# Patient Record
Sex: Female | Born: 1970 | Race: White | Hispanic: No | Marital: Married | Smoking: Former smoker
Health system: Southern US, Community
[De-identification: ages and names within clinical notes are randomized; demographics above are authoritative.]

## PROBLEM LIST (undated history)

## (undated) DIAGNOSIS — F329 Major depressive disorder, single episode, unspecified: Secondary | ICD-10-CM

## (undated) DIAGNOSIS — F32A Depression, unspecified: Secondary | ICD-10-CM

## (undated) DIAGNOSIS — Z8719 Personal history of other diseases of the digestive system: Secondary | ICD-10-CM

## (undated) HISTORY — PX: KNEE SURGERY: SHX244

---

## 1999-05-11 HISTORY — PX: OTHER SURGICAL HISTORY: SHX169

## 2001-12-31 ENCOUNTER — Emergency Department (HOSPITAL_COMMUNITY): Admission: EM | Admit: 2001-12-31 | Discharge: 2001-12-31 | Payer: Self-pay | Admitting: Emergency Medicine

## 2004-05-10 HISTORY — PX: HERNIA REPAIR: SHX51

## 2005-03-24 ENCOUNTER — Encounter (INDEPENDENT_AMBULATORY_CARE_PROVIDER_SITE_OTHER): Payer: Self-pay | Admitting: Specialist

## 2005-03-24 ENCOUNTER — Ambulatory Visit (HOSPITAL_BASED_OUTPATIENT_CLINIC_OR_DEPARTMENT_OTHER): Admission: RE | Admit: 2005-03-24 | Discharge: 2005-03-24 | Payer: Self-pay | Admitting: General Surgery

## 2005-03-24 ENCOUNTER — Ambulatory Visit (HOSPITAL_COMMUNITY): Admission: RE | Admit: 2005-03-24 | Discharge: 2005-03-24 | Payer: Self-pay | Admitting: General Surgery

## 2007-05-09 ENCOUNTER — Emergency Department (HOSPITAL_COMMUNITY): Admission: EM | Admit: 2007-05-09 | Discharge: 2007-05-09 | Payer: Self-pay | Admitting: Emergency Medicine

## 2007-05-11 HISTORY — PX: BREAST SURGERY: SHX581

## 2008-03-12 ENCOUNTER — Encounter: Admission: RE | Admit: 2008-03-12 | Discharge: 2008-03-12 | Payer: Self-pay | Admitting: Orthopedic Surgery

## 2010-09-25 NOTE — Op Note (Signed)
[name], Toni Copeland               ACCOUNT NO.:  [id number]   MEDICAL RECORD NO.:  [id number]          PATIENT TYPE:  AMB   LOCATION:  [facility]                         FACILITY:  [facility]   PHYSICIAN:  Leonie Man, M.D.   DATE OF BIRTH:  [birth year]   DATE OF PROCEDURE:  03/24/2005  DATE OF DISCHARGE:                                 OPERATIVE REPORT   PREOPERATIVE DIAGNOSES:  Lipoma left back.   POSTOPERATIVE DIAGNOSES:  Lipoma left back.   PROCEDURE:  Excision lipoma left back.   SURGEON:  Leonie Man, M.D.   ASSISTANT:  OR tech.   ANESTHESIA:  General. The patient is a 39 year old woman with a lipoma of  her left back which has been causing her some mild discomfort. She wishes to  have this removed. She comes to the operating room after the risks and  potential benefits of surgery have been discussed. All questions answered,  consent obtained.   PROCEDURE:  Following the induction of satisfactory general anesthesia, the  patient is turned into the prone position and the mass which was located on  the end of the right subscapular region is palpated. The skin over the mass  is prepped and draped to be included in a sterile operative field. I  infiltrated the subcutaneous tissues with 0.25% Marcaine with epinephrine. A  transverse incision is made over the mass deep and through the skin and  subcutaneous tissue, carried down to the capsule of the lipoma which was  then opened. The lipoma is dissected free. There was a portion of the tongue  of the lipoma which went intramuscularly underneath the latissimus dorsi  muscle which was then retracted and the lipoma dissected from the  submuscular capsule. The mass is removed and forwarded for pathologic  evaluation. Hemostasis is assured with electrocautery. Sponge and instrument  counts verified. The subcutaneous tissue is closed with a running 3-0  Vicryl, skin closed with a running 4-0 Monocryl and then reinforced with  Steri-Strips. A sterile compressive dressing is applied, the anesthetic  reversed and the patient removed from the operating room to the recovery  room in stable condition. She tolerated the procedure well.      Leonie Man, M.D.  Electronically Signed     PB/MEDQ  D:  03/24/2005  T:  03/24/2005  Job:  [id number]

## 2011-12-29 ENCOUNTER — Other Ambulatory Visit (HOSPITAL_COMMUNITY): Payer: Self-pay | Admitting: Gynecology

## 2011-12-29 DIAGNOSIS — Z3141 Encounter for fertility testing: Secondary | ICD-10-CM

## 2012-01-04 ENCOUNTER — Ambulatory Visit (HOSPITAL_COMMUNITY)
Admission: RE | Admit: 2012-01-04 | Discharge: 2012-01-04 | Disposition: A | Discharge status: Home / Self Care | Payer: BC Managed Care – PPO | Source: Ambulatory Visit | Attending: Gynecology | Admitting: Gynecology

## 2012-01-04 DIAGNOSIS — Z3141 Encounter for fertility testing: Secondary | ICD-10-CM

## 2012-01-04 DIAGNOSIS — N979 Female infertility, unspecified: Secondary | ICD-10-CM | POA: Insufficient documentation

## 2012-01-04 MED ORDER — IOHEXOL 300 MG/ML  SOLN: 9.0000 mL | Freq: Once | INTRAMUSCULAR | Status: AC | PRN

## 2013-02-20 ENCOUNTER — Ambulatory Visit (INDEPENDENT_AMBULATORY_CARE_PROVIDER_SITE_OTHER): Payer: BC Managed Care – PPO | Admitting: General Surgery

## 2013-02-20 ENCOUNTER — Telehealth (INDEPENDENT_AMBULATORY_CARE_PROVIDER_SITE_OTHER): Payer: Self-pay | Admitting: *Deleted

## 2013-02-20 ENCOUNTER — Encounter (INDEPENDENT_AMBULATORY_CARE_PROVIDER_SITE_OTHER): Payer: Self-pay | Admitting: General Surgery

## 2013-02-20 ENCOUNTER — Encounter (INDEPENDENT_AMBULATORY_CARE_PROVIDER_SITE_OTHER): Payer: Self-pay

## 2013-02-20 VITALS — BP 122/82 | HR 93 | Resp 16 | Ht 69.0 in | Wt 206.6 lb

## 2013-02-20 DIAGNOSIS — M549 Dorsalgia, unspecified: Secondary | ICD-10-CM | POA: Insufficient documentation

## 2013-02-20 NOTE — Telephone Encounter (Signed)
LMOM for pt to return my call.  I was calling to inform her of an appt for her CT scan at [facility] on 02/23/13 with an arrival time of 3:00pm.  Pt needs to pick contrast up from the 301 location at least 1 day prior to scan.  NO solid foods 4 hours prior to scan.  Pt must drink 1st bottle of contrast at 1:20pm and second bottle at 2:20pm.

## 2013-02-20 NOTE — Patient Instructions (Signed)
Will call with results of CT Call your orthopedic doc about right shoulder pain

## 2013-02-23 ENCOUNTER — Ambulatory Visit
Admission: RE | Admit: 2013-02-23 | Discharge: 2013-02-23 | Disposition: A | Discharge status: Home / Self Care | Payer: BC Managed Care – PPO | Source: Ambulatory Visit | Attending: General Surgery | Admitting: General Surgery

## 2013-02-23 DIAGNOSIS — M549 Dorsalgia, unspecified: Secondary | ICD-10-CM

## 2013-02-23 MED ORDER — IOHEXOL 300 MG/ML  SOLN
125.0000 mL | Freq: Once | INTRAMUSCULAR | Status: AC | PRN
  Administered 2013-02-23: 125 mL via INTRAVENOUS

## 2013-02-27 ENCOUNTER — Telehealth (INDEPENDENT_AMBULATORY_CARE_PROVIDER_SITE_OTHER): Payer: Self-pay

## 2013-02-27 NOTE — Telephone Encounter (Signed)
Called patient with negative CT results.

## 2013-02-27 NOTE — Telephone Encounter (Signed)
Message copied by Brennan Bailey on Tue Feb 27, 2013 10:55 AM ------      Message from: Caleen Essex III      Created: Mon Feb 26, 2013 12:15 PM       Negative exam ------

## 2013-03-15 ENCOUNTER — Other Ambulatory Visit: Payer: Self-pay

## 2013-03-27 NOTE — Progress Notes (Signed)
Patient ID: Toni Copeland, female   DOB: Apr 07, 1971, 42 y.o.   MRN: [id number]  Chief Complaint  Patient presents with  . New Evaluation    eval lump on back    HPI Toni Copeland is a 42 y.o. female.  We are asked to see the patient in consultation by Dr. Piedad Climes to evaluate her for a mass on her back. The patient is a 42 year old white female 2 has been having pain in her back for the last year or 2. She notices it mostly when she is pushing something. She states that she had a mass removed from her back in this location about 10-15 years ago. Her main complaint today is of shoulder pain.  HPI  History reviewed. No pertinent past medical history.  Past Surgical History  Procedure Laterality Date  . Knee surgery Left 1990's&2008  . Breast surgery  2009    Reduction  . Lipoma removal  2001    back  . Hernia repair  2006    Ventral hernia repair    Family History  Problem Relation Age of Onset  . Cancer Brother     Brain    Social History History  Substance Use Topics  . Smoking status: Former Smoker    Quit date: 01/22/2011  . Smokeless tobacco: Not on file  . Alcohol Use: Yes     Comment: 3 drinks a week    Allergies  Allergen Reactions  . Levaquin [Levofloxacin] Hives    No current outpatient prescriptions on file.   No current facility-administered medications for this visit.    Review of Systems Review of Systems  Constitutional: Negative.   HENT: Negative.   Eyes: Negative.   Respiratory: Negative.   Cardiovascular: Negative.   Gastrointestinal: Negative.   Endocrine: Negative.   Genitourinary: Negative.   Musculoskeletal: Positive for arthralgias and back pain.  Skin: Negative.   Allergic/Immunologic: Negative.   Neurological: Negative.   Hematological: Negative.   Psychiatric/Behavioral: Negative.     Blood pressure 122/82, pulse 93, resp. rate 16, height 5\' 9"  (1.753 m), weight 206 lb 9.6 oz (93.713 kg), SpO2 99.00%.  Physical  Exam Physical Exam  Constitutional: She is oriented to person, place, and time. She appears well-developed and well-nourished.  HENT:  Head: Normocephalic and atraumatic.  Eyes: Conjunctivae and EOM are normal. Pupils are equal, round, and reactive to light.  Neck: Normal range of motion. Neck supple.  Cardiovascular: Normal rate, regular rhythm and normal heart sounds.   Pulmonary/Chest: Effort normal and breath sounds normal.  I do not appreciate an palpable mass in her back and her site of previous resection  Abdominal: Soft. Bowel sounds are normal. There is no tenderness.  Musculoskeletal:  She has significant pain in her shoulder with range of motion  Neurological: She is alert and oriented to person, place, and time.  Skin: Skin is warm and dry.  Psychiatric: She has a normal mood and affect. Her behavior is normal.    Data Reviewed As above  Assessment    The patient is concerned that she has a recurrence of the mass on her back. I do not appreciate a mass on physical exam today. Because of this I think it would be reasonable to obtain a CT scan of her abdomen and pelvis to look for any evidence of recurrence. I also think she needs to see an orthopedic surgeon for her shoulder and I have called Dr. Allie Bossier and he has agreed to see  her today     Plan    Plan for CT scan of her abdomen and pelvis as well as a referral to an orthopedic surgeon to day        TOTH III,Nimra Puccinelli S 03/27/2013, 10:28 AM

## 2014-11-23 IMAGING — CT CT ABD-PELV W/ CM
2 of 4 series · 17 of 46 positions shown, 19 images · IV contrast (READICAT/WATER & [ID] OMNI 300)
Comparison: None.

CLINICAL DATA: Worsening right upper flank pain. Evaluate for
lipoma.

EXAM:
CT ABDOMEN AND PELVIS WITH CONTRAST
TECHNIQUE: Multidetector CT imaging of the abdomen and pelvis was performed
using the standard protocol following bolus administration of
intravenous contrast.
CONTRAST:  125mL OMNIPAQUE IOHEXOL 300 MG/ML  SOLN

[Series 3: abd/pelvis with · axial · 0.76mm/px · z∈[-445,+20]mm · 14 of 103 slices shown, 16 images]
[im 5/103  soft-tissue]
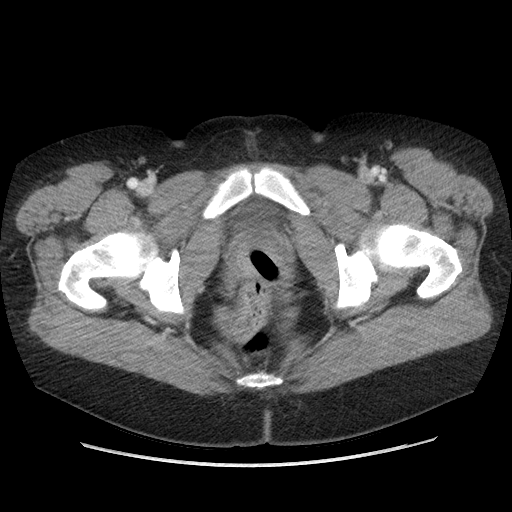
[im 5/103  bone]
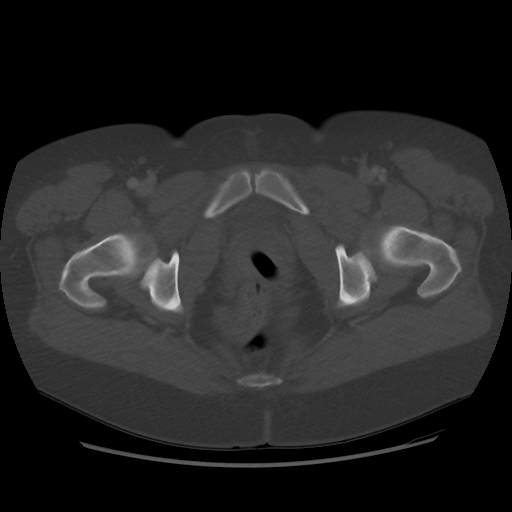
[im 14/103  soft-tissue]
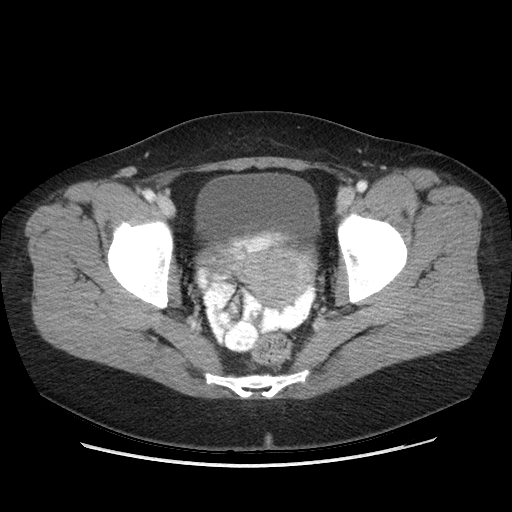
[im 18/103  soft-tissue]
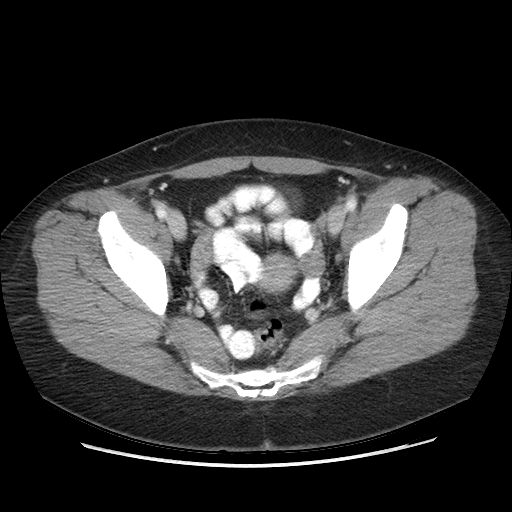
[im 27/103  soft-tissue]
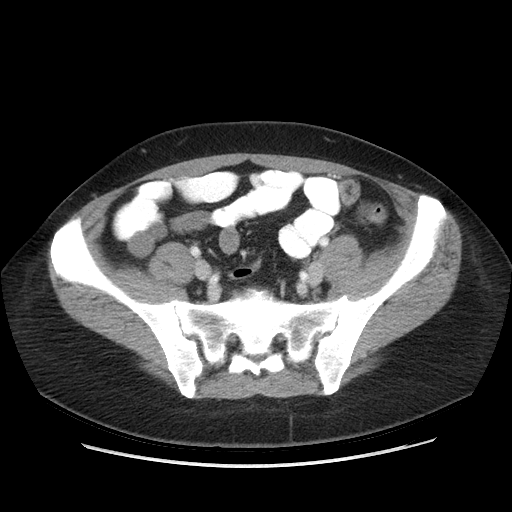
[im 36/103  soft-tissue]
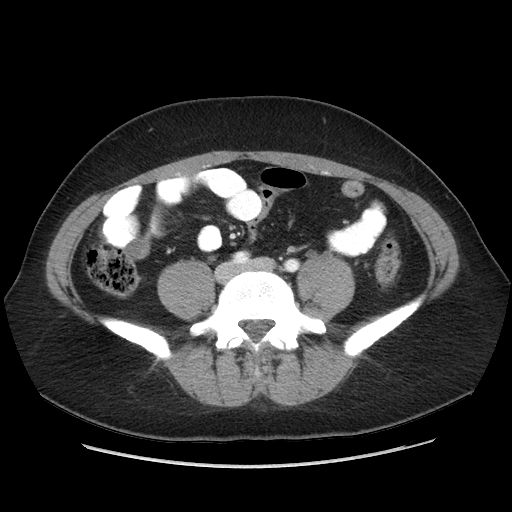
[im 40/103  soft-tissue]
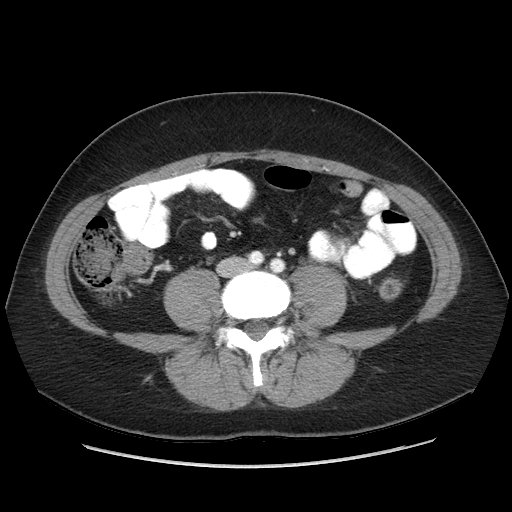
[im 49/103  soft-tissue]
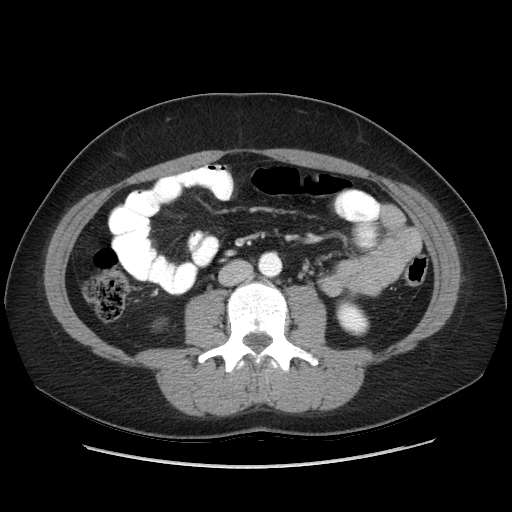
[im 54/103  soft-tissue]
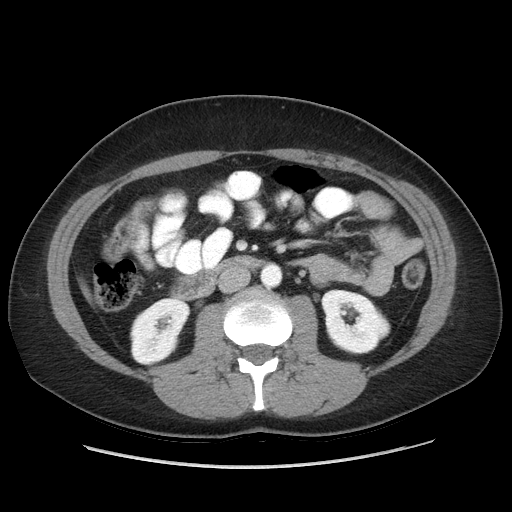
[im 63/103  soft-tissue]
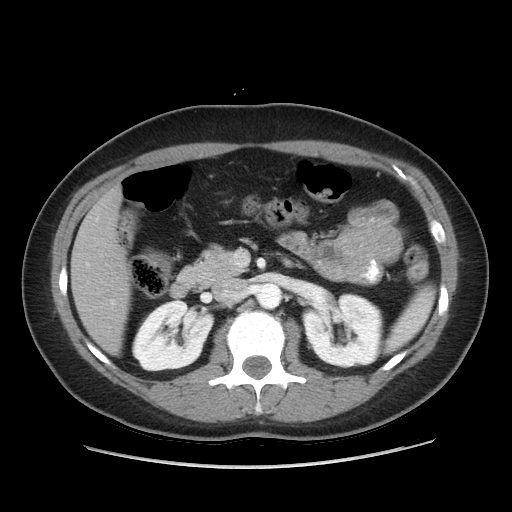
[im 63/103  bone]
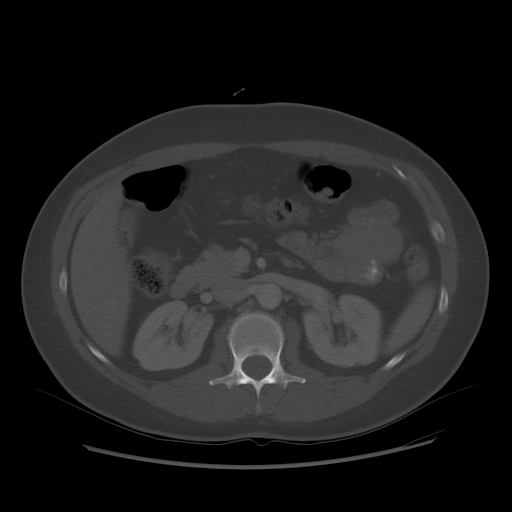
[im 67/103  soft-tissue]
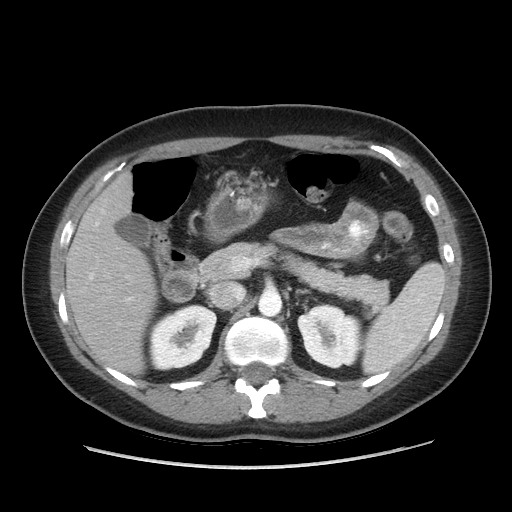
[im 76/103  soft-tissue]
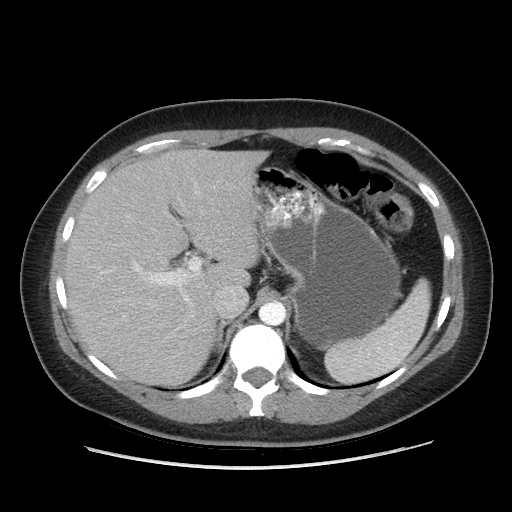
[im 85/103  soft-tissue]
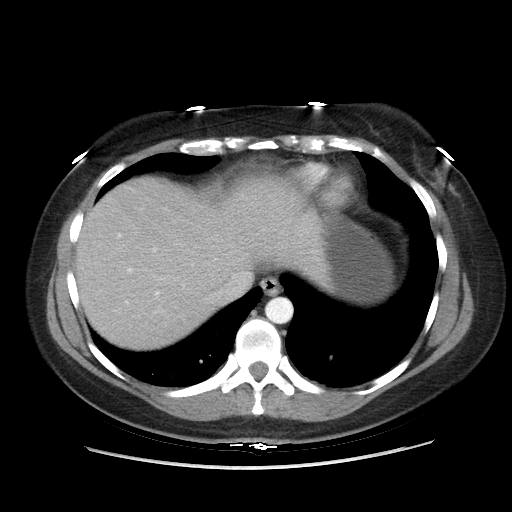
[im 89/103  soft-tissue]
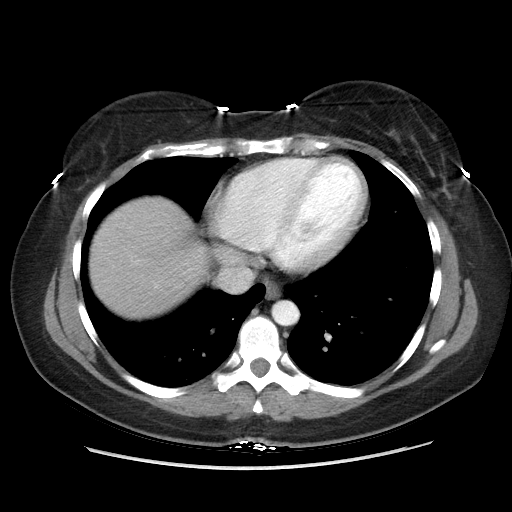
[im 98/103  soft-tissue]
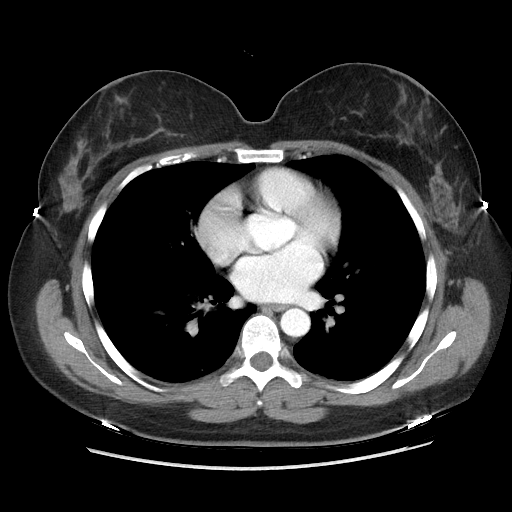

[Series 200: coronal · coronal · 1.08mm/px · 3 of 126 slices shown]
[im 42/126  soft-tissue]
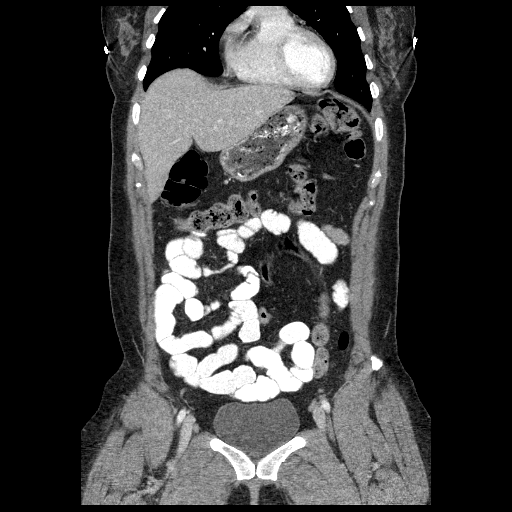
[im 56/126  soft-tissue]
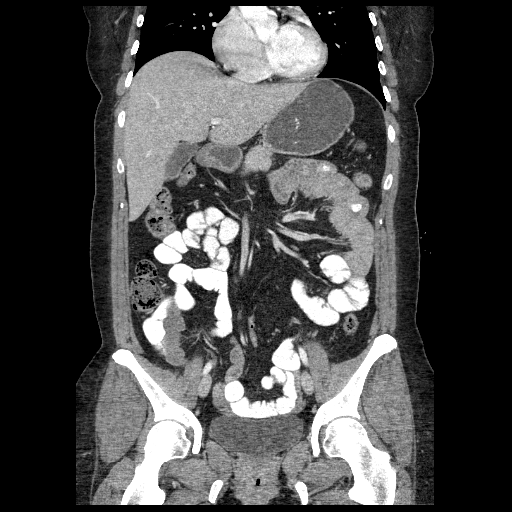
[im 70/126  soft-tissue]
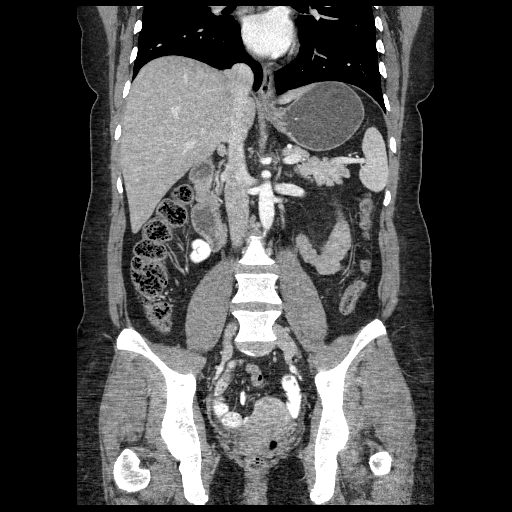

[17 of 46 positions shown; findings below may reference images not displayed]

FINDINGS: The lung bases appear clear. There is no pleural or pericardial
effusion identified. No focal liver abnormality identified. The
gallbladder appears normal. No biliary dilatation. Normal appearance
of the pancreas. The spleen is unremarkable.

The adrenal glands are both normal. The kidneys are both stress set
normal appearance of the right kidney. There is a small (8 mm cyst
within the upper pole the left kidney.

Urinary bladder appears normal. Uterus and the adnexal structures
are unremarkable.

Normal caliber of the abdominal aorta. No aneurysm. There is no
upper abdominal adenopathy identified. No pelvic or inguinal
adenopathy noted.

The stomach appears normal. The small bowel loops are normal in
course and caliber. No obstruction. Normal appearance of the colon.

Review of the visualized bony structures is on unremarkable. No
worrisome lytic or sclerotic bone lesions. A fiducial marker was
placed over the right flank in the area of concern. No underlying
mass identified, image 30/series 3.
IMPRESSION: 1.  Normal exam.

## 2018-06-08 ENCOUNTER — Other Ambulatory Visit: Payer: Self-pay | Admitting: Gastroenterology

## 2018-06-08 DIAGNOSIS — R1011 Right upper quadrant pain: Secondary | ICD-10-CM

## 2018-06-22 ENCOUNTER — Encounter (HOSPITAL_COMMUNITY): Payer: Self-pay

## 2018-06-22 ENCOUNTER — Encounter (HOSPITAL_COMMUNITY)
Admission: RE | Admit: 2018-06-22 | Discharge: 2018-06-22 | Disposition: A | Discharge status: Home / Self Care | Payer: BLUE CROSS/BLUE SHIELD | Source: Ambulatory Visit | Attending: Gastroenterology | Admitting: Gastroenterology

## 2018-06-22 ENCOUNTER — Encounter (HOSPITAL_COMMUNITY): Admission: RE | Admit: 2018-06-22 | Payer: BLUE CROSS/BLUE SHIELD | Source: Ambulatory Visit

## 2018-06-22 DIAGNOSIS — R1011 Right upper quadrant pain: Secondary | ICD-10-CM | POA: Diagnosis not present

## 2018-07-05 ENCOUNTER — Other Ambulatory Visit: Payer: Self-pay | Admitting: Surgery

## 2018-07-06 ENCOUNTER — Encounter (HOSPITAL_COMMUNITY): Payer: Self-pay | Admitting: *Deleted

## 2018-07-10 NOTE — Patient Instructions (Addendum)
Toni Copeland  07/10/2018   Your procedure is scheduled on: 07-13-18    Report to [facility] Main  Entrance    Report to Admitting at 7:55 AM    Call this number if you have problems the morning of surgery [phone]    Remember: Do not eat food or drink liquids :After Midnight. NO SOLID FOOD AFTER MIDNIGHT THE NIGHT PRIOR TO SURGERY. NOTHING BY MOUTH EXCEPT CLEAR LIQUIDS UNTIL 3 HOURS PRIOR TO SCHEULED SURGERY. PLEASE FINISH ENSURE DRINK PER SURGEON ORDER 3 HOURS PRIOR TO SCHEDULED SURGERY TIME WHICH NEEDS TO BE COMPLETED AT 6:55 AM.    CLEAR LIQUID DIET   Foods Allowed                                                                     Foods Excluded  Coffee and tea, regular and decaf                             liquids that you cannot  Plain Jell-O in any flavor                                             see through such as: Fruit ices (not with fruit pulp)                                     milk, soups, orange juice  Iced Popsicles                                    All solid food Carbonated beverages, regular and diet                                    Cranberry, grape and apple juices Sports drinks like Gatorade Lightly seasoned clear broth or consume(fat free) Sugar, honey syrup  Sample Menu Breakfast                                Lunch                                     Supper Cranberry juice                    Beef broth                            Chicken broth Jell-O  Grape juice                           Apple juice Coffee or tea                        Jell-O                                      Popsicle                                                Coffee or tea                        Coffee or tea  _____________________________________________________________________    BRUSH YOUR TEETH MORNING OF SURGERY AND RINSE YOUR MOUTH OUT, NO CHEWING GUM CANDY OR MINTS.     Take these medicines the  morning of surgery with A SIP OF WATER:  None                                You may not have any metal on your body including hair pins and              piercings  Do not wear jewelry, make-up, lotions, powders or perfumes, deodorant             Do not wear nail polish.  Do not shave  48 hours prior to surgery.             Do not bring valuables to the hospital. [facility] IS NOT             RESPONSIBLE   FOR VALUABLES.  Contacts, dentures or bridgework may not be worn into surgery.       Patients discharged the day of surgery will not be allowed to drive home. IF YOU ARE HAVING SURGERY AND GOING HOME THE SAME DAY, YOU MUST HAVE AN ADULT TO DRIVE YOU HOME AND BE WITH YOU FOR 24 HOURS. YOU MAY GO HOME BY TAXI OR UBER OR ORTHERWISE, BUT AN ADULT MUST ACCOMPANY YOU HOME AND STAY WITH YOU FOR 24 HOURS.    Name and phone number of your driver: Donnelly Stager [phone]  Special Instructions: N/A              Please read over the following fact sheets you were given: _____________________________________________________________________             [facility] - Preparing for Surgery Before surgery, you can play an important role.  Because skin is not sterile, your skin needs to be as free of germs as possible.  You can reduce the number of germs on your skin by washing with CHG (chlorahexidine gluconate) soap before surgery.  CHG is an antiseptic cleaner which kills germs and bonds with the skin to continue killing germs even after washing. Please DO NOT use if you have an allergy to CHG or antibacterial soaps.  If your skin becomes reddened/irritated stop using the CHG and inform your nurse when you arrive at Short Stay. Do not shave (including legs and underarms) for at least 48 hours  prior to the first CHG shower.  You may shave your face/neck. Please follow these instructions carefully:  1.  Shower with CHG Soap the night before surgery and the  morning of Surgery.  2.  If you choose  to wash your hair, wash your hair first as usual with your  normal  shampoo.  3.  After you shampoo, rinse your hair and body thoroughly to remove the  shampoo.                           4.  Use CHG as you would any other liquid soap.  You can apply chg directly  to the skin and wash                       Gently with a scrungie or clean washcloth.  5.  Apply the CHG Soap to your body ONLY FROM THE NECK DOWN.   Do not use on face/ open                           Wound or open sores. Avoid contact with eyes, ears mouth and genitals (private parts).                       Wash face,  Genitals (private parts) with your normal soap.             6.  Wash thoroughly, paying special attention to the area where your surgery  will be performed.  7.  Thoroughly rinse your body with warm water from the neck down.  8.  DO NOT shower/wash with your normal soap after using and rinsing off  the CHG Soap.                9.  Pat yourself dry with a clean towel.            10.  Wear clean pajamas.            11.  Place clean sheets on your bed the night of your first shower and do not  sleep with pets. Day of Surgery : Do not apply any lotions/deodorants the morning of surgery.  Please wear clean clothes to the hospital/surgery center.  FAILURE TO FOLLOW THESE INSTRUCTIONS MAY RESULT IN THE CANCELLATION OF YOUR SURGERY PATIENT SIGNATURE_________________________________  NURSE SIGNATURE__________________________________  ________________________________________________________________________

## 2018-07-11 ENCOUNTER — Other Ambulatory Visit: Payer: Self-pay

## 2018-07-11 ENCOUNTER — Encounter (HOSPITAL_COMMUNITY): Payer: Self-pay

## 2018-07-11 ENCOUNTER — Encounter (HOSPITAL_COMMUNITY)
Admission: RE | Admit: 2018-07-11 | Discharge: 2018-07-11 | Disposition: A | Discharge status: Home / Self Care | Payer: BLUE CROSS/BLUE SHIELD | Source: Ambulatory Visit | Attending: Surgery | Admitting: Surgery

## 2018-07-11 DIAGNOSIS — Z01812 Encounter for preprocedural laboratory examination: Secondary | ICD-10-CM | POA: Diagnosis present

## 2018-07-11 HISTORY — DX: Personal history of other diseases of the digestive system: Z87.19

## 2018-07-11 HISTORY — DX: Depression, unspecified: F32.A

## 2018-07-11 HISTORY — DX: Major depressive disorder, single episode, unspecified: F32.9

## 2018-07-11 LAB — CBC
HCT: 41.4 % (ref 36.0–46.0)
Hemoglobin: 13.6 g/dL (ref 12.0–15.0)
MCH: 30.6 pg (ref 26.0–34.0)
MCHC: 32.9 g/dL (ref 30.0–36.0)
MCV: 93 fL (ref 80.0–100.0)
Platelets: 228 10*3/uL (ref 150–400)
RBC: 4.45 MIL/uL (ref 3.87–5.11)
RDW: 12.4 % (ref 11.5–15.5)
WBC: 8.2 10*3/uL (ref 4.0–10.5)
nRBC: 0 % (ref 0.0–0.2)

## 2018-07-11 LAB — PREGNANCY, URINE: Preg Test, Ur: NEGATIVE

## 2018-07-12 NOTE — Anesthesia Preprocedure Evaluation (Addendum)
Anesthesia Evaluation  Patient identified by MRN, date of birth, ID band Patient awake    Reviewed: Allergy & Precautions, NPO status , Patient's Chart, lab work & pertinent test results  Airway Mallampati: II  TM Distance: >3 FB Neck ROM: Full    Dental  (+) Dental Advisory Given, Teeth Intact   Pulmonary neg pulmonary ROS, former smoker,    Pulmonary exam normal breath sounds clear to auscultation       Cardiovascular negative cardio ROS Normal cardiovascular exam Rhythm:Regular Rate:Normal     Neuro/Psych PSYCHIATRIC DISORDERS Depression negative neurological ROS     GI/Hepatic Neg liver ROS, hiatal hernia,   Endo/Other  negative endocrine ROS  Renal/GU negative Renal ROS     Musculoskeletal negative musculoskeletal ROS (+)   Abdominal (+) + obese,   Peds  Hematology negative hematology ROS (+)   Anesthesia Other Findings Day of surgery medications reviewed with the patient.  Reproductive/Obstetrics                            Anesthesia Physical Anesthesia Plan  ASA: II  Anesthesia Plan: General   Post-op Pain Management:    Induction: Intravenous  PONV Risk Score and Plan: 4 or greater and Ondansetron, Dexamethasone, Midazolam, Scopolamine patch - Pre-op and Treatment may vary due to age or medical condition  Airway Management Planned: Oral ETT  Additional Equipment: None  Intra-op Plan:   Post-operative Plan: Extubation in OR  Informed Consent: I have reviewed the patients History and Physical, chart, labs and discussed the procedure including the risks, benefits and alternatives for the proposed anesthesia with the patient or authorized representative who has indicated his/her understanding and acceptance.     Dental advisory given  Plan Discussed with: CRNA  Anesthesia Plan Comments:         Anesthesia Quick Evaluation

## 2018-07-12 NOTE — H&P (Signed)
Toni Copeland Documented: 07/05/2018 3:03 PM Location: [facility] Patient #: [id number] DOB: 1970/09/12 Single / Language: [place] / Race: White Female   History of Present Illness Toni Copeland A. Magnus Ivan MD; 07/05/2018 3:30 PM) The patient is a 48 year old female who presents for evaluation of gall stones. this patient is referred by Dr. Charna Elizabeth for symptomatic gallstones. She has been having abdominal pain in the epigastrium with nausea and bloating for several months. It is getting worse. It occurs after fatty meals. She has had no emesis. The pain is moderate in intensity. She has been having loose bowel movements as well. She had an ultrasound showing multiple gallstones. The bile duct was normal. She has had a mildly elevated AST and ALT in the past. She is otherwise healthy without complaints.   Past Surgical History Toni Copeland, CMA; 07/05/2018 3:26 PM) Knee Surgery  Left. Mammoplasty; Reduction  Bilateral. Ventral / Umbilical Hernia Surgery  Left.  Diagnostic Studies History Toni Copeland, CMA; 07/05/2018 3:26 PM) Mammogram  never Pap Smear  >5 years ago  Allergies Toni Copeland, CMA; 07/05/2018 3:04 PM) Levaquin *Fluoroquinolones**   Medication History Toni Copeland, CMA; 07/05/2018 3:04 PM) No Current Medications Medications Reconciled  Social History Toni Copeland, CMA; 07/05/2018 3:26 PM) Alcohol use  Occasional alcohol use. Caffeine use  Carbonated beverages, Tea. No drug use  Tobacco use  Former smoker.  Family History Toni Copeland, CMA; 07/05/2018 3:26 PM) Cancer  Brother. Hypertension  Mother. Seizure disorder  Brother.  Pregnancy / Birth History Toni Copeland, CMA; 07/05/2018 3:26 PM) Age at menarche  15 years. Gravida  1 Maternal age  68-40 Para  0 Regular periods   Other Problems Toni Copeland, CMA; 07/05/2018 3:26 PM) Anxiety Disorder  Back Pain  Cholelithiasis  Depression  Hemorrhoids   Umbilical Hernia Repair     Review of Systems (Toni Copeland CMA; 07/05/2018 3:26 PM) General Present- Appetite Loss and Fatigue. Not Present- Chills, Fever, Night Sweats, Weight Gain and Weight Loss. Skin Not Present- Change in Wart/Mole, Dryness, Hives, Jaundice, New Lesions, Non-Healing Wounds, Rash and Ulcer. HEENT Present- Wears glasses/contact lenses. Not Present- Earache, Hearing Loss, Hoarseness, Nose Bleed, Oral Ulcers, Ringing in the Ears, Seasonal Allergies, Sinus Pain, Sore Throat, Visual Disturbances and Yellow Eyes. Respiratory Not Present- Bloody sputum, Chronic Cough, Difficulty Breathing, Snoring and Wheezing. Breast Not Present- Breast Mass, Breast Pain, Nipple Discharge and Skin Changes. Cardiovascular Not Present- Chest Pain, Difficulty Breathing Lying Down, Leg Cramps, Palpitations, Rapid Heart Rate, Shortness of Breath and Swelling of Extremities. Gastrointestinal Present- Abdominal Pain, Bloating, Change in Bowel Habits, Chronic diarrhea, Constipation, Excessive gas, Hemorrhoids and Nausea. Not Present- Bloody Stool, Difficulty Swallowing, Gets full quickly at meals, Indigestion, Rectal Pain and Vomiting. Musculoskeletal Present- Back Pain. Not Present- Joint Pain, Joint Stiffness, Muscle Pain, Muscle Weakness and Swelling of Extremities. Neurological Not Present- Decreased Memory, Fainting, Headaches, Numbness, Seizures, Tingling, Tremor, Trouble walking and Weakness. Psychiatric Present- Anxiety. Not Present- Bipolar, Change in Sleep Pattern, Depression, Fearful and Frequent crying. Endocrine Not Present- Cold Intolerance, Excessive Hunger, Hair Changes, Heat Intolerance, Hot flashes and New Diabetes. Hematology Not Present- Blood Thinners, Easy Bruising, Excessive bleeding, Gland problems, HIV and Persistent Infections.  Vitals (Toni Copeland CMA; 07/05/2018 3:04 PM) 07/05/2018 3:04 PM Weight: 204.4 lb Height: 70in Body Surface Area: 2.11 m Body Mass Index:  29.33 kg/m  Pulse: 60 (Regular)  BP: 124/72 (Sitting, Left Arm, Standard)       Physical Exam (Charlsie Fleeger A. Magnus Ivan MD; 07/05/2018 3:30 PM) General  Mental Status-Alert. General Appearance-Consistent with stated age. Hydration-Well hydrated. Voice-Normal.  Head and Neck Head-normocephalic, atraumatic with no lesions or palpable masses.  Eye Eyeball - Bilateral-Extraocular movements intact. Sclera/Conjunctiva - Bilateral-No scleral icterus.  Chest and Lung Exam Chest and lung exam reveals -quiet, even and easy respiratory effort with no use of accessory muscles and on auscultation, normal breath sounds, no adventitious sounds and normal vocal resonance. Inspection Chest Wall - Normal. Back - normal.  Cardiovascular Cardiovascular examination reveals -on palpation PMI is normal in location and amplitude, no palpable S3 or S4. Normal cardiac borders., normal heart sounds, regular rate and rhythm with no murmurs, carotid auscultation reveals no bruits and normal pedal pulses bilaterally.  Abdomen Inspection Inspection of the abdomen reveals - No Hernias. Skin - Scar - no surgical scars. Palpation/Percussion Palpation and Percussion of the abdomen reveal - Soft, No Rebound tenderness, No Rigidity (guarding) and No hepatosplenomegaly. Tenderness - Right Upper Quadrant. Auscultation Auscultation of the abdomen reveals - Bowel sounds normal.  Neurologic - Did not examine.  Musculoskeletal - Did not examine.    Assessment & Plan (Yvonne Petite A. Magnus Ivan MD; 07/05/2018 3:31 PM) SYMPTOMATIC CHOLELITHIASIS (K80.20) Impression: I reviewed her ultrasound and showed her the images. She clearly has symptomatic cholelithiasis and I suspect some chronic cholecystitis. Laparoscopic cholecystectomy with possible cholangiogram is recommended. I discussed the procedure with her in detail. I discussed the risks which includes but is not limited to bleeding, infection, injury to  surrounding structures, the need to convert to an open procedure, cardiopulmonary issues, postoperative recovery, etc. She understands and wishes to proceed with surgery Current Plans

## 2018-07-13 ENCOUNTER — Ambulatory Visit (HOSPITAL_COMMUNITY): Payer: BLUE CROSS/BLUE SHIELD | Admitting: Physician Assistant

## 2018-07-13 ENCOUNTER — Encounter (HOSPITAL_COMMUNITY)
Admission: RE | Disposition: A | Discharge status: Home / Self Care | Payer: Self-pay | Source: Other Acute Inpatient Hospital | Attending: Surgery

## 2018-07-13 ENCOUNTER — Encounter (HOSPITAL_COMMUNITY): Payer: Self-pay | Admitting: Emergency Medicine

## 2018-07-13 ENCOUNTER — Ambulatory Visit (HOSPITAL_COMMUNITY): Payer: BLUE CROSS/BLUE SHIELD | Admitting: Anesthesiology

## 2018-07-13 ENCOUNTER — Ambulatory Visit (HOSPITAL_COMMUNITY)
Admission: RE | Admit: 2018-07-13 | Discharge: 2018-07-13 | Disposition: A | Discharge status: Home / Self Care | Payer: BLUE CROSS/BLUE SHIELD | Source: Other Acute Inpatient Hospital | Attending: Surgery | Admitting: Surgery

## 2018-07-13 ENCOUNTER — Other Ambulatory Visit: Payer: Self-pay

## 2018-07-13 DIAGNOSIS — K8020 Calculus of gallbladder without cholecystitis without obstruction: Secondary | ICD-10-CM | POA: Diagnosis present

## 2018-07-13 DIAGNOSIS — K8010 Calculus of gallbladder with chronic cholecystitis without obstruction: Secondary | ICD-10-CM | POA: Insufficient documentation

## 2018-07-13 DIAGNOSIS — Z87891 Personal history of nicotine dependence: Secondary | ICD-10-CM | POA: Diagnosis not present

## 2018-07-13 DIAGNOSIS — Z881 Allergy status to other antibiotic agents status: Secondary | ICD-10-CM | POA: Insufficient documentation

## 2018-07-13 HISTORY — PX: CHOLECYSTECTOMY: SHX55

## 2018-07-13 SURGERY — LAPAROSCOPIC CHOLECYSTECTOMY WITH INTRAOPERATIVE CHOLANGIOGRAM
Anesthesia: General | Site: Abdomen

## 2018-07-13 MED ORDER — OXYCODONE HCL 5 MG PO TABS
5.0000 mg | ORAL_TABLET | Freq: Once | ORAL | Status: AC | PRN
  Administered 2018-07-13: 5 mg via ORAL

## 2018-07-13 MED ORDER — 0.9 % SODIUM CHLORIDE (POUR BTL) OPTIME
TOPICAL | Status: DC | PRN
  Administered 2018-07-13: 1000 mL

## 2018-07-13 MED ORDER — CHLORHEXIDINE GLUCONATE CLOTH 2 % EX PADS: 6.0000 | MEDICATED_PAD | Freq: Once | CUTANEOUS | Status: DC

## 2018-07-13 MED ORDER — LIDOCAINE 2% (20 MG/ML) 5 ML SYRINGE
INTRAMUSCULAR | Status: AC
  Filled 2018-07-13: qty 5

## 2018-07-13 MED ORDER — DEXAMETHASONE SODIUM PHOSPHATE 10 MG/ML IJ SOLN
INTRAMUSCULAR | Status: AC
  Filled 2018-07-13: qty 1

## 2018-07-13 MED ORDER — ACETAMINOPHEN 500 MG PO TABS
1000.0000 mg | ORAL_TABLET | ORAL | Status: AC
  Administered 2018-07-13: 1000 mg via ORAL
  Filled 2018-07-13: qty 2

## 2018-07-13 MED ORDER — KETAMINE HCL 10 MG/ML IJ SOLN
INTRAMUSCULAR | Status: AC
  Filled 2018-07-13: qty 1

## 2018-07-13 MED ORDER — GABAPENTIN 300 MG PO CAPS
300.0000 mg | ORAL_CAPSULE | ORAL | Status: AC
  Administered 2018-07-13: 300 mg via ORAL
  Filled 2018-07-13: qty 1

## 2018-07-13 MED ORDER — LACTATED RINGERS IR SOLN
Status: DC | PRN
  Administered 2018-07-13: 1000 mL

## 2018-07-13 MED ORDER — FENTANYL CITRATE (PF) 250 MCG/5ML IJ SOLN
INTRAMUSCULAR | Status: AC
  Filled 2018-07-13: qty 5

## 2018-07-13 MED ORDER — SUGAMMADEX SODIUM 200 MG/2ML IV SOLN
INTRAVENOUS | Status: AC
  Filled 2018-07-13: qty 2

## 2018-07-13 MED ORDER — KETAMINE HCL 10 MG/ML IJ SOLN
INTRAMUSCULAR | Status: DC | PRN
  Administered 2018-07-13: 15 mg via INTRAVENOUS
  Administered 2018-07-13: 20 mg via INTRAVENOUS

## 2018-07-13 MED ORDER — ONDANSETRON HCL 4 MG/2ML IJ SOLN
INTRAMUSCULAR | Status: DC | PRN
  Administered 2018-07-13: 4 mg via INTRAVENOUS

## 2018-07-13 MED ORDER — MIDAZOLAM HCL 2 MG/2ML IJ SOLN
INTRAMUSCULAR | Status: DC | PRN
  Administered 2018-07-13: 2 mg via INTRAVENOUS

## 2018-07-13 MED ORDER — BUPIVACAINE HCL (PF) 0.5 % IJ SOLN
INTRAMUSCULAR | Status: DC | PRN
  Administered 2018-07-13: 20 mL

## 2018-07-13 MED ORDER — LIDOCAINE 2% (20 MG/ML) 5 ML SYRINGE
INTRAMUSCULAR | Status: DC | PRN
  Administered 2018-07-13: 1.5 mg/kg/h via INTRAVENOUS

## 2018-07-13 MED ORDER — OXYCODONE HCL 5 MG PO TABS
ORAL_TABLET | ORAL | Status: AC
  Filled 2018-07-13: qty 1

## 2018-07-13 MED ORDER — MIDAZOLAM HCL 2 MG/2ML IJ SOLN
INTRAMUSCULAR | Status: AC
  Filled 2018-07-13: qty 2

## 2018-07-13 MED ORDER — SUGAMMADEX SODIUM 200 MG/2ML IV SOLN
INTRAVENOUS | Status: DC | PRN
  Administered 2018-07-13: 200 mg via INTRAVENOUS

## 2018-07-13 MED ORDER — PROPOFOL 10 MG/ML IV BOLUS
INTRAVENOUS | Status: DC | PRN
  Administered 2018-07-13: 160 mg via INTRAVENOUS

## 2018-07-13 MED ORDER — PROPOFOL 10 MG/ML IV BOLUS
INTRAVENOUS | Status: AC
  Filled 2018-07-13: qty 40

## 2018-07-13 MED ORDER — HYDROMORPHONE HCL 1 MG/ML IJ SOLN
0.2500 mg | INTRAMUSCULAR | Status: DC | PRN
  Administered 2018-07-13 (×2): 0.5 mg via INTRAVENOUS

## 2018-07-13 MED ORDER — LIDOCAINE 2% (20 MG/ML) 5 ML SYRINGE
INTRAMUSCULAR | Status: DC | PRN
  Administered 2018-07-13: 60 mg via INTRAVENOUS

## 2018-07-13 MED ORDER — LACTATED RINGERS IV SOLN
INTRAVENOUS | Status: DC
  Administered 2018-07-13: 09:00:00 via INTRAVENOUS

## 2018-07-13 MED ORDER — ROCURONIUM BROMIDE 10 MG/ML (PF) SYRINGE
PREFILLED_SYRINGE | INTRAVENOUS | Status: DC | PRN
  Administered 2018-07-13: 50 mg via INTRAVENOUS

## 2018-07-13 MED ORDER — BUPIVACAINE HCL (PF) 0.5 % IJ SOLN
INTRAMUSCULAR | Status: AC
  Filled 2018-07-13: qty 30

## 2018-07-13 MED ORDER — CELECOXIB 200 MG PO CAPS
200.0000 mg | ORAL_CAPSULE | ORAL | Status: AC
  Administered 2018-07-13: 200 mg via ORAL
  Filled 2018-07-13: qty 1

## 2018-07-13 MED ORDER — LIDOCAINE HCL 2 % IJ SOLN
INTRAMUSCULAR | Status: AC
  Filled 2018-07-13: qty 20

## 2018-07-13 MED ORDER — HYDROMORPHONE HCL 1 MG/ML IJ SOLN
INTRAMUSCULAR | Status: AC
  Filled 2018-07-13: qty 1

## 2018-07-13 MED ORDER — OXYCODONE HCL 5 MG PO TABS: 5.0000 mg | ORAL_TABLET | Freq: Four times a day (QID) | ORAL | 0 refills | Status: DC | PRN

## 2018-07-13 MED ORDER — OXYCODONE HCL 5 MG/5ML PO SOLN: 5.0000 mg | Freq: Once | ORAL | Status: AC | PRN

## 2018-07-13 MED ORDER — MEPERIDINE HCL 50 MG/ML IJ SOLN: 6.2500 mg | INTRAMUSCULAR | Status: DC | PRN

## 2018-07-13 MED ORDER — CEFAZOLIN SODIUM-DEXTROSE 2-4 GM/100ML-% IV SOLN
2.0000 g | INTRAVENOUS | Status: AC
  Administered 2018-07-13: 2 g via INTRAVENOUS
  Filled 2018-07-13: qty 100

## 2018-07-13 MED ORDER — ROCURONIUM BROMIDE 100 MG/10ML IV SOLN
INTRAVENOUS | Status: AC
  Filled 2018-07-13: qty 1

## 2018-07-13 MED ORDER — IOPAMIDOL (ISOVUE-300) INJECTION 61%
INTRAVENOUS | Status: AC
  Filled 2018-07-13: qty 50

## 2018-07-13 MED ORDER — FENTANYL CITRATE (PF) 250 MCG/5ML IJ SOLN
INTRAMUSCULAR | Status: DC | PRN
  Administered 2018-07-13: 100 ug via INTRAVENOUS

## 2018-07-13 MED ORDER — ONDANSETRON HCL 4 MG/2ML IJ SOLN
INTRAMUSCULAR | Status: AC
  Filled 2018-07-13: qty 2

## 2018-07-13 MED ORDER — DEXAMETHASONE SODIUM PHOSPHATE 10 MG/ML IJ SOLN
INTRAMUSCULAR | Status: DC | PRN
  Administered 2018-07-13: 8 mg via INTRAVENOUS

## 2018-07-13 MED ORDER — PROMETHAZINE HCL 25 MG/ML IJ SOLN: 6.2500 mg | INTRAMUSCULAR | Status: DC | PRN

## 2018-07-13 SURGICAL SUPPLY — 30 items
ADH SKN CLS APL DERMABOND .7 (GAUZE/BANDAGES/DRESSINGS) ×1
APPLIER CLIP 5 13 M/L LIGAMAX5 (MISCELLANEOUS) ×2
APR CLP MED LRG 5 ANG JAW (MISCELLANEOUS) ×1
BAG SPEC RTRVL LRG 6X4 10 (ENDOMECHANICALS) ×1
CABLE HIGH FREQUENCY MONO STRZ (ELECTRODE) ×2 IMPLANT
CHLORAPREP W/TINT 26ML (MISCELLANEOUS) ×2 IMPLANT
CLIP APPLIE 5 13 M/L LIGAMAX5 (MISCELLANEOUS) ×1 IMPLANT
COVER MAYO STAND STRL (DRAPES) IMPLANT
COVER WAND RF STERILE (DRAPES) ×1 IMPLANT
DECANTER SPIKE VIAL GLASS SM (MISCELLANEOUS) ×2 IMPLANT
DERMABOND ADVANCED (GAUZE/BANDAGES/DRESSINGS) ×1
DERMABOND ADVANCED .7 DNX12 (GAUZE/BANDAGES/DRESSINGS) ×1 IMPLANT
DRAPE C-ARM 42X120 X-RAY (DRAPES) IMPLANT
ELECT REM PT RETURN 15FT ADLT (MISCELLANEOUS) ×2 IMPLANT
GLOVE SURG SIGNA 7.5 PF LTX (GLOVE) ×2 IMPLANT
GOWN STRL REUS W/TWL XL LVL3 (GOWN DISPOSABLE) ×4 IMPLANT
HEMOSTAT SURGICEL 4X8 (HEMOSTASIS) IMPLANT
KIT BASIN OR (CUSTOM PROCEDURE TRAY) ×2 IMPLANT
POUCH SPECIMEN RETRIEVAL 10MM (ENDOMECHANICALS) ×2 IMPLANT
SCISSORS LAP 5X35 DISP (ENDOMECHANICALS) ×2 IMPLANT
SET CHOLANGIOGRAPH MIX (MISCELLANEOUS) IMPLANT
SET IRRIG TUBING LAPAROSCOPIC (IRRIGATION / IRRIGATOR) ×2 IMPLANT
SET TUBE SMOKE EVAC HIGH FLOW (TUBING) ×2 IMPLANT
SLEEVE XCEL OPT CAN 5 100 (ENDOMECHANICALS) ×4 IMPLANT
SUT MNCRL AB 4-0 PS2 18 (SUTURE) ×2 IMPLANT
TOWEL OR 17X26 10 PK STRL BLUE (TOWEL DISPOSABLE) ×2 IMPLANT
TOWEL OR NON WOVEN STRL DISP B (DISPOSABLE) ×2 IMPLANT
TRAY LAPAROSCOPIC (CUSTOM PROCEDURE TRAY) ×2 IMPLANT
TROCAR BLADELESS OPT 5 100 (ENDOMECHANICALS) ×2 IMPLANT
TROCAR XCEL BLUNT TIP 100MML (ENDOMECHANICALS) ×2 IMPLANT

## 2018-07-13 NOTE — Interval H&P Note (Signed)
History and Physical Interval Note:no change in H and P  07/13/2018 9:15 AM  Toni Copeland  has presented today for surgery, with the diagnosis of symptomatic gallstones  The various methods of treatment have been discussed with the patient and family. After consideration of risks, benefits and other options for treatment, the patient has consented to  Procedure(s): LAPAROSCOPIC CHOLECYSTECTOMY WITH POSSIBLE  INTRAOPERATIVE CHOLANGIOGRAM ERAS PATHWAY (N/A) as a surgical intervention .  The patient's history has been reviewed, patient examined, no change in status, stable for surgery.  I have reviewed the patient's chart and labs.  Questions were answered to the patient's satisfaction.     Abigail Miyamoto

## 2018-07-13 NOTE — Anesthesia Postprocedure Evaluation (Signed)
Anesthesia Post Note  Patient: Toni Copeland  Procedure(s) Performed: LAPAROSCOPIC CHOLECYSTECTOMY ERAS PATHWAY (N/A Abdomen)     Patient location during evaluation: PACU Anesthesia Type: General Level of consciousness: sedated and patient cooperative Pain management: pain level controlled Vital Signs Assessment: post-procedure vital signs reviewed and stable Respiratory status: spontaneous breathing Cardiovascular status: stable Anesthetic complications: no    Last Vitals:  Vitals:   07/13/18 1153 07/13/18 1330  BP: 121/74 117/77  Pulse: 83 72  Resp: 15 14  Temp: 36.7 C 36.7 C  SpO2: 98% 99%    Last Pain:  Vitals:   07/13/18 1345  TempSrc:   PainSc: 3                  Lewie Loron

## 2018-07-13 NOTE — Anesthesia Procedure Notes (Signed)
Procedure Name: Intubation Date/Time: 07/13/2018 9:45 AM Performed by: Eben Burow, CRNA Pre-anesthesia Checklist: Patient identified, Emergency Drugs available, Suction available, Patient being monitored and Timeout performed Patient Re-evaluated:Patient Re-evaluated prior to induction Oxygen Delivery Method: Circle system utilized Preoxygenation: Pre-oxygenation with 100% oxygen Induction Type: IV induction Ventilation: Mask ventilation without difficulty Laryngoscope Size: Mac and 4 Grade View: Grade I Tube type: Oral Number of attempts: 1 Airway Equipment and Method: Stylet Placement Confirmation: ETT inserted through vocal cords under direct vision,  positive ETCO2 and breath sounds checked- equal and bilateral Secured at: 21 cm Tube secured with: Tape Dental Injury: Teeth and Oropharynx as per pre-operative assessment

## 2018-07-13 NOTE — Op Note (Signed)
Laparoscopic Cholecystectomy Procedure Note  Indications: This patient presents with symptomatic gallbladder disease and will undergo laparoscopic cholecystectomy.  Pre-operative Diagnosis: symptomatic cholelithiasis  Post-operative Diagnosis: Same  Surgeon: Abigail Miyamoto   Assistants: 0  Anesthesia: General endotracheal anesthesia  ASA Class: 2  Procedure Details  The patient was seen again in the Holding Room. The risks, benefits, complications, treatment options, and expected outcomes were discussed with the patient. The possibilities of reaction to medication, pulmonary aspiration, perforation of viscus, bleeding, recurrent infection, finding a normal gallbladder, the need for additional procedures, failure to diagnose a condition, the possible need to convert to an open procedure, and creating a complication requiring transfusion or operation were discussed with the patient. The likelihood of improving the patient's symptoms with return to their baseline status is good.  The patient and/or family concurred with the proposed plan, giving informed consent. The site of surgery properly noted. The patient was taken to Operating Room, identified as Cain Saupe and the procedure verified as Laparoscopic Cholecystectomy with Intraoperative Cholangiogram. A Time Out was held and the above information confirmed.  Prior to the induction of general anesthesia, antibiotic prophylaxis was administered. General endotracheal anesthesia was then administered and tolerated well. After the induction, the abdomen was prepped with Chloraprep and draped in sterile fashion. The patient was positioned in the supine position.  I made an incision above the umbilicus. We dissected down to the abdominal fascia with blunt dissection.  The fascia was incised vertically and we entered the peritoneal cavity bluntly.  A pursestring suture of 0-Vicryl was placed around the fascial opening.  The Hasson cannula was  inserted and secured with the stay suture.  Pneumoperitoneum was then created with CO2 and tolerated well without any adverse changes in the patient's vital signs. A 5-mm port was placed in the subxiphoid position.  Two 5-mm ports were placed in the right upper quadrant. All skin incisions were infiltrated with a local anesthetic agent before making the incision and placing the trocars.   We positioned the patient in reverse Trendelenburg, tilted slightly to the patient's left.  The gallbladder was identified, the fundus grasped and retracted cephalad. Adhesions were lysed bluntly and with the electrocautery where indicated, taking care not to injure any adjacent organs or viscus. The infundibulum was grasped and retracted laterally, exposing the peritoneum overlying the triangle of Calot. This was then divided and exposed in a blunt fashion. The cystic duct was clearly identified and bluntly dissected circumferentially. A critical view of the cystic duct and cystic artery was obtained.  The cystic duct was then ligated with clips and divided. The cystic artery was, dissected free, ligated with clips and divided as well.   The gallbladder was dissected from the liver bed in retrograde fashion with the electrocautery. The gallbladder was removed and placed in an Endocatch sac. The liver bed was irrigated and inspected. Hemostasis was achieved with the electrocautery. Copious irrigation was utilized and was repeatedly aspirated until clear.  The gallbladder and Endocatch sac were then removed through the umbilical port site.  The pursestring suture was used to close the umbilical fascia.    We again inspected the right upper quadrant for hemostasis.  Pneumoperitoneum was released as we removed the trocars. All incisions were injected with marcaine.  4-0 Monocryl was used to close the skin.   Skin glue was then applied. The patient was then extubated and brought to the recovery room in stable condition.  Instrument, sponge, and needle counts were correct at  closure and at the conclusion of the case.   Findings: Mild chronic Cholecystitis with Cholelithiasis  Estimated Blood Loss: Minimal         Drains: 0         Specimens: Gallbladder           Complications: None; patient tolerated the procedure well.         Disposition: PACU - hemodynamically stable.         Condition: stable

## 2018-07-13 NOTE — Discharge Instructions (Signed)
[facility] ______[facility] °LAPAROSCOPIC SURGERY: POST OP INSTRUCTIONS °Always review your discharge instruction sheet given to you by the facility where your surgery was performed. °IF YOU HAVE DISABILITY OR FAMILY LEAVE FORMS, YOU MUST BRING THEM TO THE OFFICE FOR PROCESSING.   °DO NOT GIVE THEM TO YOUR DOCTOR. ° °1. A prescription for pain medication may be given to you upon discharge.  Take your pain medication as prescribed, if needed.  If narcotic pain medicine is not needed, then you may take acetaminophen (Tylenol) or ibuprofen (Advil) as needed. °2. Take your usually prescribed medications unless otherwise directed. °3. If you need a refill on your pain medication, please contact your pharmacy.  They will contact our office to request authorization. Prescriptions will not be filled after 5pm or on week-ends. °4. You should follow a light diet the first few days after arrival home, such as soup and crackers, etc.  Be sure to include lots of fluids daily. °5. Most patients will experience some swelling and bruising in the area of the incisions.  Ice packs will help.  Swelling and bruising can take several days to resolve.  °6. It is common to experience some constipation if taking pain medication after surgery.  Increasing fluid intake and taking a stool softener (such as Colace) will usually help or prevent this problem from occurring.  A mild laxative (Milk of Magnesia or Miralax) should be taken according to package instructions if there are no bowel movements after 48 hours. °7. Unless discharge instructions indicate otherwise, you may remove your bandages 24-48 hours after surgery, and you may shower at that time.  You may have steri-strips (small skin tapes) in place directly over the incision.  These strips should be left on the skin for 7-10 days.  If your surgeon used skin glue on the incision, you may shower in 24 hours.  The glue will flake off over the next 2-3 weeks.  Any sutures or  staples will be removed at the office during your follow-up visit. °8. ACTIVITIES:  You may resume regular (light) daily activities beginning the next day--such as daily self-care, walking, climbing stairs--gradually increasing activities as tolerated.  You may have sexual intercourse when it is comfortable.  Refrain from any heavy lifting or straining until approved by your doctor. °a. You may drive when you are no longer taking prescription pain medication, you can comfortably wear a seatbelt, and you can safely maneuver your car and apply brakes. °b. RETURN TO WORK:  __________________________________________________________ °9. You should see your doctor in the office for a follow-up appointment approximately 2-3 weeks after your surgery.  Make sure that you call for this appointment within a day or two after you arrive home to insure a convenient appointment time. °10. OTHER INSTRUCTIONS:OK TO SHOWER STARTING TOMORROW °11. ICE PACK, TYLENOL, IBUPROFEN ALSO FOR PAIN °12. NO LIFTING MORE THAN 15 TO 20 POUNDS FOR 2 WEEKS __________________________________________________________________________________________________________________________ __________________________________________________________________________________________________________________________ °WHEN TO CALL YOUR DOCTOR: °1. Fever over 101.0 °2. Inability to urinate °3. Continued bleeding from incision. °4. Increased pain, redness, or drainage from the incision. °5. Increasing abdominal pain ° °The clinic staff is available to answer your questions during regular business hours.  Please don’t hesitate to call and ask to speak to one of the nurses for clinical concerns.  If you have a medical emergency, go to the nearest emergency room or call 911.  A surgeon from [facility] is always on call at the hospital. °[address], [address], [place], [place]  [address] ? [address] [address], [place], [place]   [address] °[phone] ?  [phone] ? FAX [phone] °Web site: [email or web address] °

## 2018-07-13 NOTE — Interval H&P Note (Signed)
History and Physical Interval Note:no change in H and P  07/13/2018 9:22 AM  Toni Copeland  has presented today for surgery, with the diagnosis of symptomatic gallstones  The various methods of treatment have been discussed with the patient and family. After consideration of risks, benefits and other options for treatment, the patient has consented to  Procedure(s): LAPAROSCOPIC CHOLECYSTECTOMY WITH POSSIBLE  INTRAOPERATIVE CHOLANGIOGRAM ERAS PATHWAY (N/A) as a surgical intervention .  The patient's history has been reviewed, patient examined, no change in status, stable for surgery.  I have reviewed the patient's chart and labs.  Questions were answered to the patient's satisfaction.     Abigail Miyamoto

## 2018-07-13 NOTE — Transfer of Care (Signed)
Immediate Anesthesia Transfer of Care Note  Patient: Toni Copeland  Procedure(s) Performed: LAPAROSCOPIC CHOLECYSTECTOMY ERAS PATHWAY (N/A Abdomen)  Patient Location: PACU  Anesthesia Type:General  Level of Consciousness: awake, alert  and oriented  Airway & Oxygen Therapy: Patient Spontanous Breathing and Patient connected to face mask oxygen  Post-op Assessment: Report given to RN and Post -op Vital signs reviewed and stable  Post vital signs: Reviewed and stable  Last Vitals:  Vitals Value Taken Time  BP 135/89 07/13/2018 10:32 AM  Temp    Pulse 84 07/13/2018 10:33 AM  Resp 16 07/13/2018 10:33 AM  SpO2 100 % 07/13/2018 10:33 AM  Vitals shown include unvalidated device data.  Last Pain:  Vitals:   07/13/18 0828  TempSrc:   PainSc: 0-No pain      Patients Stated Pain Goal: 4 (07/13/18 4034)  Complications: No apparent anesthesia complications

## 2018-07-14 ENCOUNTER — Encounter (HOSPITAL_COMMUNITY): Payer: Self-pay | Admitting: Surgery

## 2018-08-02 ENCOUNTER — Encounter (HOSPITAL_BASED_OUTPATIENT_CLINIC_OR_DEPARTMENT_OTHER): Payer: Self-pay | Admitting: *Deleted

## 2018-08-02 ENCOUNTER — Other Ambulatory Visit: Payer: Self-pay | Admitting: Orthopaedic Surgery

## 2018-08-02 ENCOUNTER — Other Ambulatory Visit: Payer: Self-pay

## 2018-08-07 NOTE — Anesthesia Preprocedure Evaluation (Addendum)
Anesthesia Evaluation  Patient identified by MRN, date of birth, ID band Patient awake    Reviewed: Allergy & Precautions, NPO status , Patient's Chart, lab work & pertinent test results  Airway Mallampati: II  TM Distance: >3 FB Neck ROM: Full    Dental no notable dental hx.    Pulmonary neg pulmonary ROS, former smoker,    Pulmonary exam normal breath sounds clear to auscultation       Cardiovascular negative cardio ROS Normal cardiovascular exam Rhythm:Regular Rate:Normal     Neuro/Psych Depression negative neurological ROS  negative psych ROS   GI/Hepatic negative GI ROS, Neg liver ROS, hiatal hernia,   Endo/Other  negative endocrine ROS  Renal/GU negative Renal ROS  negative genitourinary   Musculoskeletal negative musculoskeletal ROS (+)   Abdominal   Peds negative pediatric ROS (+)  Hematology negative hematology ROS (+)   Anesthesia Other Findings Left ankle fracture  Reproductive/Obstetrics negative OB ROS                            Anesthesia Physical Anesthesia Plan  ASA: II  Anesthesia Plan: General and Regional   Post-op Pain Management:  Regional for Post-op pain   Induction: Intravenous  PONV Risk Score and Plan: 3 and Midazolam, Ondansetron and Dexamethasone  Airway Management Planned: LMA  Additional Equipment:   Intra-op Plan:   Post-operative Plan: Extubation in OR  Informed Consent: I have reviewed the patients History and Physical, chart, labs and discussed the procedure including the risks, benefits and alternatives for the proposed anesthesia with the patient or authorized representative who has indicated his/her understanding and acceptance.     Dental advisory given  Plan Discussed with: CRNA  Anesthesia Plan Comments:         Anesthesia Quick Evaluation

## 2018-08-08 ENCOUNTER — Ambulatory Visit (HOSPITAL_BASED_OUTPATIENT_CLINIC_OR_DEPARTMENT_OTHER): Payer: BLUE CROSS/BLUE SHIELD | Admitting: Certified Registered"

## 2018-08-08 ENCOUNTER — Encounter (HOSPITAL_BASED_OUTPATIENT_CLINIC_OR_DEPARTMENT_OTHER): Payer: Self-pay | Admitting: *Deleted

## 2018-08-08 ENCOUNTER — Other Ambulatory Visit: Payer: Self-pay

## 2018-08-08 ENCOUNTER — Ambulatory Visit (HOSPITAL_COMMUNITY): Payer: BLUE CROSS/BLUE SHIELD

## 2018-08-08 ENCOUNTER — Encounter (HOSPITAL_BASED_OUTPATIENT_CLINIC_OR_DEPARTMENT_OTHER)
Admission: RE | Disposition: A | Discharge status: Home / Self Care | Payer: Self-pay | Source: Home / Self Care | Attending: Orthopaedic Surgery

## 2018-08-08 ENCOUNTER — Ambulatory Visit (HOSPITAL_COMMUNITY)
Admission: RE | Admit: 2018-08-08 | Discharge: 2018-08-08 | Disposition: A | Discharge status: Home / Self Care | Payer: BLUE CROSS/BLUE SHIELD | Attending: Orthopaedic Surgery | Admitting: Orthopaedic Surgery

## 2018-08-08 DIAGNOSIS — S8262XA Displaced fracture of lateral malleolus of left fibula, initial encounter for closed fracture: Secondary | ICD-10-CM | POA: Insufficient documentation

## 2018-08-08 DIAGNOSIS — S93432A Sprain of tibiofibular ligament of left ankle, initial encounter: Secondary | ICD-10-CM | POA: Diagnosis not present

## 2018-08-08 DIAGNOSIS — Z87891 Personal history of nicotine dependence: Secondary | ICD-10-CM | POA: Diagnosis not present

## 2018-08-08 DIAGNOSIS — W010XXA Fall on same level from slipping, tripping and stumbling without subsequent striking against object, initial encounter: Secondary | ICD-10-CM | POA: Insufficient documentation

## 2018-08-08 DIAGNOSIS — Y92015 Private garage of single-family (private) house as the place of occurrence of the external cause: Secondary | ICD-10-CM | POA: Insufficient documentation

## 2018-08-08 DIAGNOSIS — Z419 Encounter for procedure for purposes other than remedying health state, unspecified: Secondary | ICD-10-CM

## 2018-08-08 HISTORY — PX: SYNDESMOSIS REPAIR: SHX5182

## 2018-08-08 HISTORY — PX: ORIF ANKLE FRACTURE: SHX5408

## 2018-08-08 SURGERY — OPEN REDUCTION INTERNAL FIXATION (ORIF) ANKLE FRACTURE
Anesthesia: Regional | Site: Ankle | Laterality: Left

## 2018-08-08 MED ORDER — MIDAZOLAM HCL 2 MG/2ML IJ SOLN
INTRAMUSCULAR | Status: AC
  Filled 2018-08-08: qty 2

## 2018-08-08 MED ORDER — CEFAZOLIN SODIUM-DEXTROSE 2-4 GM/100ML-% IV SOLN
INTRAVENOUS | Status: AC
  Filled 2018-08-08: qty 100

## 2018-08-08 MED ORDER — ACETAMINOPHEN 500 MG PO TABS
ORAL_TABLET | ORAL | Status: AC
  Filled 2018-08-08: qty 2

## 2018-08-08 MED ORDER — METHOCARBAMOL 500 MG PO TABS: 500.0000 mg | ORAL_TABLET | Freq: Four times a day (QID) | ORAL | 2 refills | Status: AC | PRN

## 2018-08-08 MED ORDER — LACTATED RINGERS IV SOLN
INTRAVENOUS | Status: DC
  Administered 2018-08-08 (×2): via INTRAVENOUS

## 2018-08-08 MED ORDER — LIDOCAINE 2% (20 MG/ML) 5 ML SYRINGE
INTRAMUSCULAR | Status: DC | PRN
  Administered 2018-08-08: 40 mg via INTRAVENOUS

## 2018-08-08 MED ORDER — SCOPOLAMINE 1 MG/3DAYS TD PT72
MEDICATED_PATCH | TRANSDERMAL | Status: AC
  Filled 2018-08-08: qty 1

## 2018-08-08 MED ORDER — LIDOCAINE 2% (20 MG/ML) 5 ML SYRINGE
INTRAMUSCULAR | Status: AC
  Filled 2018-08-08: qty 5

## 2018-08-08 MED ORDER — ROPIVACAINE HCL 5 MG/ML IJ SOLN
INTRAMUSCULAR | Status: DC | PRN
  Administered 2018-08-08: 50 mL via PERINEURAL

## 2018-08-08 MED ORDER — CEFAZOLIN SODIUM-DEXTROSE 2-4 GM/100ML-% IV SOLN
2.0000 g | INTRAVENOUS | Status: AC
  Administered 2018-08-08: 2 g via INTRAVENOUS

## 2018-08-08 MED ORDER — FENTANYL CITRATE (PF) 100 MCG/2ML IJ SOLN
INTRAMUSCULAR | Status: AC
  Filled 2018-08-08: qty 2

## 2018-08-08 MED ORDER — ONDANSETRON HCL 4 MG/2ML IJ SOLN
INTRAMUSCULAR | Status: AC
  Filled 2018-08-08: qty 2

## 2018-08-08 MED ORDER — MEPERIDINE HCL 25 MG/ML IJ SOLN: 6.2500 mg | INTRAMUSCULAR | Status: DC | PRN

## 2018-08-08 MED ORDER — PROMETHAZINE HCL 25 MG/ML IJ SOLN: 6.2500 mg | INTRAMUSCULAR | Status: DC | PRN

## 2018-08-08 MED ORDER — OXYCODONE HCL 5 MG PO TABS: 5.0000 mg | ORAL_TABLET | Freq: Four times a day (QID) | ORAL | 0 refills | Status: AC | PRN

## 2018-08-08 MED ORDER — PROPOFOL 10 MG/ML IV BOLUS
INTRAVENOUS | Status: DC | PRN
  Administered 2018-08-08: 200 mg via INTRAVENOUS

## 2018-08-08 MED ORDER — CHLORHEXIDINE GLUCONATE 4 % EX LIQD: 60.0000 mL | Freq: Once | CUTANEOUS | Status: DC

## 2018-08-08 MED ORDER — DEXAMETHASONE SODIUM PHOSPHATE 10 MG/ML IJ SOLN
INTRAMUSCULAR | Status: AC
  Filled 2018-08-08: qty 1

## 2018-08-08 MED ORDER — HYDROMORPHONE HCL 1 MG/ML IJ SOLN: 0.2500 mg | INTRAMUSCULAR | Status: DC | PRN

## 2018-08-08 MED ORDER — PROPOFOL 500 MG/50ML IV EMUL
INTRAVENOUS | Status: AC
  Filled 2018-08-08: qty 50

## 2018-08-08 MED ORDER — ONDANSETRON HCL 4 MG/2ML IJ SOLN
INTRAMUSCULAR | Status: DC | PRN
  Administered 2018-08-08: 4 mg via INTRAVENOUS

## 2018-08-08 MED ORDER — POVIDONE-IODINE 10 % EX SWAB: 2.0000 "application " | Freq: Once | CUTANEOUS | Status: DC

## 2018-08-08 MED ORDER — SCOPOLAMINE 1 MG/3DAYS TD PT72: 1.0000 | MEDICATED_PATCH | Freq: Once | TRANSDERMAL | Status: DC | PRN

## 2018-08-08 MED ORDER — DEXAMETHASONE SODIUM PHOSPHATE 10 MG/ML IJ SOLN
INTRAMUSCULAR | Status: DC | PRN
  Administered 2018-08-08: 10 mg via INTRAVENOUS

## 2018-08-08 MED ORDER — MIDAZOLAM HCL 2 MG/2ML IJ SOLN
1.0000 mg | INTRAMUSCULAR | Status: DC | PRN
  Administered 2018-08-08: 2 mg via INTRAVENOUS

## 2018-08-08 MED ORDER — FENTANYL CITRATE (PF) 100 MCG/2ML IJ SOLN
50.0000 ug | INTRAMUSCULAR | Status: DC | PRN
  Administered 2018-08-08: 100 ug via INTRAVENOUS

## 2018-08-08 MED ORDER — OXYCODONE HCL 5 MG/5ML PO SOLN: 5.0000 mg | Freq: Once | ORAL | Status: DC | PRN

## 2018-08-08 MED ORDER — OXYCODONE HCL 5 MG PO TABS: 5.0000 mg | ORAL_TABLET | Freq: Once | ORAL | Status: DC | PRN

## 2018-08-08 MED ORDER — ACETAMINOPHEN 500 MG PO TABS
1000.0000 mg | ORAL_TABLET | Freq: Once | ORAL | Status: AC
  Administered 2018-08-08: 1000 mg via ORAL

## 2018-08-08 SURGICAL SUPPLY — 80 items
APL PRP STRL LF DISP 70% ISPRP (MISCELLANEOUS) ×1
APL SKNCLS STERI-STRIP NONHPOA (GAUZE/BANDAGES/DRESSINGS)
BANDAGE ACE 4X5 VEL STRL LF (GAUZE/BANDAGES/DRESSINGS) ×3 IMPLANT
BANDAGE ACE 6X5 VEL STRL LF (GAUZE/BANDAGES/DRESSINGS) ×3 IMPLANT
BANDAGE ESMARK 6X9 LF (GAUZE/BANDAGES/DRESSINGS) ×1 IMPLANT
BENZOIN TINCTURE PRP APPL 2/3 (GAUZE/BANDAGES/DRESSINGS) IMPLANT
BIT DRILL 2 CANN GRADUATED (BIT) ×2 IMPLANT
BIT DRILL 2.5 CANN LNG (BIT) ×2 IMPLANT
BIT DRILL 2.7 (BIT) ×3
BIT DRILL 2.7X2.7/3XSCR ANKL (BIT) IMPLANT
BIT DRL 2.7X2.7/3XSCR ANKL (BIT) ×1
BLADE SURG 15 STRL LF DISP TIS (BLADE) ×2 IMPLANT
BLADE SURG 15 STRL SS (BLADE) ×6
BNDG CMPR 9X4 STRL LF SNTH (GAUZE/BANDAGES/DRESSINGS)
BNDG CMPR 9X6 STRL LF SNTH (GAUZE/BANDAGES/DRESSINGS) ×1
BNDG COHESIVE 4X5 TAN STRL (GAUZE/BANDAGES/DRESSINGS) ×2 IMPLANT
BNDG ESMARK 4X9 LF (GAUZE/BANDAGES/DRESSINGS) IMPLANT
BNDG ESMARK 6X9 LF (GAUZE/BANDAGES/DRESSINGS) ×3
CHLORAPREP W/TINT 26 (MISCELLANEOUS) ×3 IMPLANT
CLOSURE WOUND 1/2 X4 (GAUZE/BANDAGES/DRESSINGS)
COVER BACK TABLE REUSABLE LG (DRAPES) ×3 IMPLANT
COVER WAND RF STERILE (DRAPES) IMPLANT
CUFF TOURN SGL QUICK 34 (TOURNIQUET CUFF) ×3
CUFF TRNQT CYL 34X4.125X (TOURNIQUET CUFF) ×1 IMPLANT
DECANTER SPIKE VIAL GLASS SM (MISCELLANEOUS) IMPLANT
DRAPE C-ARM 42X72 X-RAY (DRAPES) ×2 IMPLANT
DRAPE C-ARMOR (DRAPES) ×2 IMPLANT
DRAPE EXTREMITY T 121X128X90 (DISPOSABLE) ×3 IMPLANT
DRAPE IMP U-DRAPE 54X76 (DRAPES) ×3 IMPLANT
DRAPE OEC MINIVIEW 54X84 (DRAPES) IMPLANT
DRAPE U-SHAPE 47X51 STRL (DRAPES) ×3 IMPLANT
ELECT REM PT RETURN 9FT ADLT (ELECTROSURGICAL) ×3
ELECTRODE REM PT RTRN 9FT ADLT (ELECTROSURGICAL) ×1 IMPLANT
GAUZE SPONGE 4X4 12PLY STRL (GAUZE/BANDAGES/DRESSINGS) ×3 IMPLANT
GAUZE XEROFORM 1X8 LF (GAUZE/BANDAGES/DRESSINGS) ×3 IMPLANT
GLOVE BIO SURGEON STRL SZ7.5 (GLOVE) ×3 IMPLANT
GLOVE BIOGEL PI IND STRL 7.0 (GLOVE) IMPLANT
GLOVE BIOGEL PI IND STRL 8 (GLOVE) ×1 IMPLANT
GLOVE BIOGEL PI INDICATOR 7.0 (GLOVE) ×4
GLOVE BIOGEL PI INDICATOR 8 (GLOVE) ×2
GLOVE SURG SS PI 6.5 STRL IVOR (GLOVE) ×2 IMPLANT
GOWN STRL REUS W/ TWL LRG LVL3 (GOWN DISPOSABLE) ×1 IMPLANT
GOWN STRL REUS W/ TWL XL LVL3 (GOWN DISPOSABLE) ×1 IMPLANT
GOWN STRL REUS W/TWL LRG LVL3 (GOWN DISPOSABLE) ×6
GOWN STRL REUS W/TWL XL LVL3 (GOWN DISPOSABLE) ×6
IMPL TIGHTROP W/DRV K-LESS (Anchor) IMPLANT
IMPLANT TIGHTROPE W/DRV K-LESS (Anchor) ×3 IMPLANT
NS IRRIG 1000ML POUR BTL (IV SOLUTION) ×3 IMPLANT
PACK BASIN DAY SURGERY FS (CUSTOM PROCEDURE TRAY) ×3 IMPLANT
PAD CAST 4YDX4 CTTN HI CHSV (CAST SUPPLIES) ×1 IMPLANT
PADDING CAST COTTON 4X4 STRL (CAST SUPPLIES) ×3
PADDING CAST SYNTHETIC 4 (CAST SUPPLIES) ×4
PADDING CAST SYNTHETIC 4X4 STR (CAST SUPPLIES) ×1 IMPLANT
PENCIL BUTTON HOLSTER BLD 10FT (ELECTRODE) ×3 IMPLANT
PLATE DISTAL FIBULA 4H LOCKING (Plate) ×2 IMPLANT
SCREW CANCELLOUS 3X16MM (Screw) ×2 IMPLANT
SCREW LO PRO 2.7X22MM CORTEX (Screw) ×2 IMPLANT
SCREW LOCK T10 FT 18X2.7X (Screw) IMPLANT
SCREW LOCKING 2.7X14MM (Screw) ×2 IMPLANT
SCREW LOCKING 2.7X18MM (Screw) ×3 IMPLANT
SCREW LOW PROFILE 3.5X14 (Screw) ×4 IMPLANT
SCREW NON-LOCKING 3.5X12MM (Screw) ×2 IMPLANT
SLEEVE SCD COMPRESS KNEE MED (MISCELLANEOUS) ×1 IMPLANT
SPLINT FIBERGLASS 4X30 (CAST SUPPLIES) ×3 IMPLANT
SPONGE LAP 18X18 RF (DISPOSABLE) ×2 IMPLANT
STAPLER VISISTAT 35W (STAPLE) IMPLANT
STOCKINETTE 6  STRL (DRAPES) ×2
STOCKINETTE 6 STRL (DRAPES) ×1 IMPLANT
STRIP CLOSURE SKIN 1/2X4 (GAUZE/BANDAGES/DRESSINGS) IMPLANT
SUCTION FRAZIER HANDLE 10FR (MISCELLANEOUS) ×2
SUCTION TUBE FRAZIER 10FR DISP (MISCELLANEOUS) ×1 IMPLANT
SUT ETHILON 3 0 PS 1 (SUTURE) ×3 IMPLANT
SUT MNCRL AB 3-0 PS2 18 (SUTURE) ×3 IMPLANT
SUT PDS AB 2-0 CT2 27 (SUTURE) ×3 IMPLANT
SUT VIC AB 3-0 FS2 27 (SUTURE) IMPLANT
SYR BULB 3OZ (MISCELLANEOUS) ×3 IMPLANT
TOWEL GREEN STERILE FF (TOWEL DISPOSABLE) ×6 IMPLANT
TUBE CONNECTING 20'X1/4 (TUBING) ×1
TUBE CONNECTING 20X1/4 (TUBING) ×2 IMPLANT
UNDERPAD 30X30 (UNDERPADS AND DIAPERS) ×3 IMPLANT

## 2018-08-08 NOTE — H&P (Signed)
Toni Copeland is an 48 y.o. female.   Chief Complaint: Displaced left lateral malleolus fracture HPI: Angelica Chessman is a 48 year old female who slipped in her garage on Monday, 07/31/2018.  She had pain and deformity in her left ankle.  She was seen at an urgent care and diagnosed with a left lateral malleolus fracture.  She was placed in a splint.  No reduction maneuver was performed.  Patient was seen in my office and given the displacement of her lateral malleolus was indicated for operative intervention.  She is here today for surgery.  She continues to have pain in her left ankle.  She is been taking oxycodone.  She has been maintaining nonweightbearing with crutches.  She denies any other joint or extremity pain.  Previously had some pain in her left knee but this is resolved.  Denies any knee swelling.  Past Medical History:  Diagnosis Date  . Depression   . History of hiatal hernia     Past Surgical History:  Procedure Laterality Date  . BREAST SURGERY  2009   Reduction  . CHOLECYSTECTOMY N/A 07/13/2018   Procedure: LAPAROSCOPIC CHOLECYSTECTOMY ERAS PATHWAY;  Surgeon: Abigail Miyamoto, MD;  Location: [facility];  Service: General;  Laterality: N/A;  . HERNIA REPAIR  2006   Ventral hernia repair  . KNEE SURGERY Left 1990's&2008  . lipoma removal  2001   back    Family History  Problem Relation Age of Onset  . Cancer Brother        Brain   Social History:  reports that she quit smoking about 7 years ago. Her smoking use included cigarettes. She has a 20.00 pack-year smoking history. She has never used smokeless tobacco. She reports current alcohol use of about 1.0 standard drinks of alcohol per week. She reports that she does not use drugs.  Allergies:  Allergies  Allergen Reactions  . Levaquin [Levofloxacin] Hives    Medications Prior to Admission  Medication Sig Dispense Refill  . Multiple Vitamin (MULTIVITAMIN WITH MINERALS) TABS tablet Take 1 tablet by mouth at bedtime.    Marland Kitchen  oxyCODONE (OXY IR/ROXICODONE) 5 MG immediate release tablet Take 1-2 tablets (5-10 mg total) by mouth every 6 (six) hours as needed for moderate pain, severe pain or breakthrough pain. 25 tablet 0    No results found for this or any previous visit (from the past 48 hour(s)). No results found.  Review of Systems  Constitutional: Negative.   HENT: Negative.   Eyes: Negative.   Respiratory: Negative.   Cardiovascular: Negative.   Gastrointestinal: Negative.   Musculoskeletal:       Left ankle pain  Skin: Negative.   Neurological: Negative.   Endo/Heme/Allergies: Negative.   Psychiatric/Behavioral: Negative.     Blood pressure 116/63, pulse 91, temperature 98.3 F (36.8 C), temperature source Oral, height 5\' 10"  (1.778 m), weight 88 kg, last menstrual period 07/16/2018. Physical Exam  Constitutional: She appears well-developed.  HENT:  Head: Normocephalic.  Eyes: Conjunctivae are normal.  Neck: Neck supple.  Cardiovascular: Normal rate.  Respiratory: Effort normal.  GI: Soft.  Musculoskeletal:     Comments: Left ankle demonstrates short leg splint in place.  Toes exposed are warm and well-perfused.  She endorses sensation of her toes.  Able to wiggle toes.  No tenderness proximal to the splint.  Patient is able to range her knee without difficulty or crepitance.  No swelling about the knee.   Neurological: She is alert.  Skin: Skin is warm.  Psychiatric:  She has a normal mood and affect.     Assessment/Plan We will proceed with open treatment of her left lateral malleolus fracture.  Will stress the syndesmosis at this time and if there is any medial clear space widening or syndesmotic widening will perform open reduction and fixation of her syndesmosis.  We will also take an x-ray of her left knee given her prior pain to make sure that there is no bony injury notable.  She understands the risk, benefits alternatives of surgery which are discussed in detail.  The risk discussed  included but were not limited to wound healing complications, infection, nonunion, malunion, development of arthritis, continued pain.  We also discussed the perioperative and anesthetic risk which included death.  She understands there is a risk for need for further surgery or damage surrounding structures as well.  She understands the postoperative weightbearing restrictions and agrees to comply.  Terance Hart, MD 08/08/2018, 12:09 PM

## 2018-08-08 NOTE — Op Note (Signed)
Toni Copeland female 48 y.o. 08/08/2018  PreOperative Diagnosis: Left displaced lateral malleolus fracture  PostOperative Diagnosis: Left displaced lateral malleolus fracture Syndesmotic ligament disruption Left ankle fluoroscopic stress view under anesthesia  PROCEDURE: Open treatment of left lateral malleolus fracture Open treatment of syndesmosis, left  SURGEON: Dub Mikes, MD  ASSISTANT: None  ANESTHESIA: General LMA with peripheral nerve block  FINDINGS: Left lateral malleolus fracture, displaced Left syndesmotic ligament disruption with widening of the syndesmosis on stress view  IMPLANTS: Arthrex distal fibular locking plate Arthrex tight rope  INDICATIONS:47 y.o. female slipped in her garage sustaining a left lateral malleolus fracture.  She was seen in my office and given the amount of displacement of the fracture was indicated for open treatment.  We had a lengthy conversation about the risk, benefits and alternatives of surgery which included but were not limited to wound healing complications, infection, nonunion, malunion, need for further surgery, damage surrounding structures, development of arthritis continued ankle pain and perioperative anesthetic risk which include death.  After weighing these risks she opted to proceed with surgery.  We also discussed stressing the ankle after fibular fixation to assess for syndesmotic ligament disruption and widening.  The patient had no significant past medical history and therefore was of normal risk for surgery.  PROCEDURE: Patient was identified in the preoperative holding area.  The left leg was marked by myself.  The consent was signed by myself and the patient.  Anesthesia performed a peripheral nerve block.  She was taken the operative suite placed supine on the operative table.  General LMA anesthesia was induced without difficulty.  Preoperative antibiotics were given.  A tourniquet was placed on the left  thigh and a bump was placed under the left hip.  All bony prominences were well-padded.  A bone foam was used.  The left lower extremity was then prepped and draped in the usual sterile fashion.  A surgical timeout was performed.  We began by making a longitudinal incision overlying the distal fibula centered over the fracture.  This was taken sharply down through skin and subcutaneous tissue.  Blunt dissection was used to identify branches of the superficial peroneal nerve which were not found within the surgical field.  Then sharply soft tissue flaps were created anteriorly and posteriorly in an extra-periosteal fashion.  Then the fracture site was identified and the using a 15 blade the periosteal edges were removed and cleaned.  The fracture was opened up and a rondure was used to clean out the fracture hematoma.  Then a curette was used to curette the ends of the bone proximally and distally.  This was found to be a simple oblique fracture of the distal fibula.  Then using manipulation the ankle was externally rotated and we are able to see into the lateral gutter of the joint.  The fracture hematoma within the joint was removed with a rondure and irrigation was used to irrigate the joint copiously.  There was no evidence of cartilaginous damage to the lateral gutter of the talus and the visible tibial plafond.  Then the fracture was reduced under direct visualization using a lobster claw clamp.  Intraoperative fluoroscopy was used to determine if adequate reduction had been obtained and it was.  Then a 2 7 lag screw was placed from anterior to posterior proximal to distal fashion and had good purchase.  Then a distal fibular locking plate was placed in a neutralization fashion.  Accommodation of nonlocking and locking screws were used.  Plate benders were used to contour the plate appropriately.  Maintenance of reduction was obtained.  Then under direct visualization the syndesmosis was inspected.  Using a  Therapist, nutritional is able to create motion within the syndesmosis.  Then stress fluoroscopy was used and there was found to be widening at the syndesmosis indicating syndesmotic ligament disruption.  Then the syndesmosis was reduced under direct visualization and clamped with a Weber clamp.  Then an Arthrex tight rope was placed across the syndesmosis parallel to the joint within the syndesmotic joint.  After placement of the tight rope the ankle was re-stressed and found to be stable at the syndesmosis.  Then final films were obtained with intraoperative fluoroscopy.  We then irrigated the surgical site.  The tourniquet was released and hemostasis was obtained.  The deep tissue was closed with 2-0 PDS.  The subcuticular tissue was closed with 3-0 Monocryl and the skin with 3-0 nylon in an interrupted fashion.  Soft dressing of Xeroform, 4 x 4's and sterile she cotton was placed.  She was then placed in a short leg splint.  All counts were correct at the end of the case.  There were no complications.  She was awake from anesthesia and taken to recovery room in stable condition.  POST OPERATIVE INSTRUCTIONS: Nonweightbearing left lower extremity Keep splint dry and elevate limb Return to clinic in 2 weeks for splint removal, suture removal, x-rays and placement of a short leg nonweightbearing cast. Patient will remain nonweightbearing minimum of 6 weeks given syndesmosis repair. No need for DVT prophylaxis in this healthy patient.   TOURNIQUET TIME:45 minutes  BLOOD LOSS:  Minimal         DRAINS: none         SPECIMEN: none       COMPLICATIONS:  * No complications entered in OR log *         Disposition: PACU - hemodynamically stable.         Condition: stable

## 2018-08-08 NOTE — Anesthesia Procedure Notes (Signed)
Anesthesia Regional Block: Popliteal block   Pre-Anesthetic Checklist: ,, timeout performed, Correct Patient, Correct Site, Correct Laterality, Correct Procedure, Correct Position, site marked, Risks and benefits discussed,  Surgical consent,  Pre-op evaluation,  At surgeon's request and post-op pain management  Laterality: Left  Prep: chloraprep       Needles:  Injection technique: Single-shot  Needle Type: Stimiplex     Needle Length: 9cm  Needle Gauge: 21     Additional Needles:   Procedures:,,,, ultrasound used (permanent image in chart),,,,  Narrative:  Start time: 08/08/2018 1:19 PM End time: 08/08/2018 1:24 PM Injection made incrementally with aspirations every 5 mL.  Performed by: Personally  Anesthesiologist: Lowella Curb, MD       20 ml 0.5% Ropivacaine to Left Adductor Canal space in addition to Popliteal block

## 2018-08-08 NOTE — Anesthesia Procedure Notes (Signed)
Procedure Name: LMA Insertion Date/Time: 08/08/2018 1:43 PM Performed by: Burna Cash, CRNA Pre-anesthesia Checklist: Patient identified, Emergency Drugs available, Suction available and Patient being monitored Patient Re-evaluated:Patient Re-evaluated prior to induction Oxygen Delivery Method: Circle system utilized Preoxygenation: Pre-oxygenation with 100% oxygen Induction Type: IV induction Ventilation: Mask ventilation without difficulty LMA: LMA inserted LMA Size: 4.0 Number of attempts: 1 Airway Equipment and Method: Bite block Placement Confirmation: positive ETCO2 Tube secured with: Tape Dental Injury: Teeth and Oropharynx as per pre-operative assessment

## 2018-08-08 NOTE — Discharge Instructions (Signed)
[facility] POST-OP INSTRUCTIONS ° ° °Pain Management °1. The numbing medicine and your leg will last around 18 hours, take a dose of your pain medicine as soon as you feel it wearing off to avoid rebound pain. °2. Keep your foot elevated above heart level.  Make sure that your heel hangs free ('floats'). °3. Take all prescribed medication as directed. °4. If taking narcotic pain medication you may want to use an over-the-counter stool softener to avoid constipation. °5. You may take over-the-counter NSAIDs (ibuprofen, naproxen, etc.) as well as over-the-counter acetaminophen as directed on the packaging as a supplement for your pain and may also use it to wean away from the prescription medication. ° °Activity °? Non-weightbearing °? Postoperatively, you will be placed into a splint which stays on for 2 weeks and then will be changed at your first postop visit. ° °First Postoperative Visit °1. Your first postop visit will be at least 2 weeks after surgery.  This should be scheduled when you schedule surgery. °2. If you do not have a postoperative visit scheduled please call [phone] to schedule an appointment. °3. At the appointment your incision will be evaluated for suture removal, x-rays will be obtained if necessary. ° °General Instructions °1. Swelling is very common after foot and ankle surgery.  It often takes 3 months for the foot and ankle to begin to feel comfortable.  Some amount of swelling will persist for 6-12 months. °2. DO NOT change the dressing.  If there is a problem with the dressing (too tight, loose, gets wet, etc.) please contact Dr. Adair's office. °3. DO NOT get the dressing wet.  For showers you can use an over-the-counter cast cover or wrap a washcloth around the top of your dressing and then cover it with a plastic bag and tape it to your leg. °4. DO NOT soak the incision (no tubs, pools, bath, etc.) until you have approval from Dr. Adair. ° °Contact Dr. Adairs  office or go to Emergency Room if: °1. Temperature above 101° F. °2. Increasing pain that is unresponsive to pain medication or elevation °3. Excessive redness or swelling in your foot °4. Dressing problems - excessive bloody drainage, looseness or tightness, or if dressing gets wet °5. Develop pain, swelling, warmth, or discoloration of your calf ° ° °Post Anesthesia Home Care Instructions ° °Activity: °Get plenty of rest for the remainder of the day. A responsible individual must stay with you for 24 hours following the procedure.  °For the next 24 hours, DO NOT: °-Drive a car °-Operate machinery °-Drink alcoholic beverages °-Take any medication unless instructed by your physician °-Make any legal decisions or sign important papers. ° °Meals: °Start with liquid foods such as gelatin or soup. Progress to regular foods as tolerated. Avoid greasy, spicy, heavy foods. If nausea and/or vomiting occur, drink only clear liquids until the nausea and/or vomiting subsides. Call your physician if vomiting continues. ° °Special Instructions/Symptoms: °Your throat may feel dry or sore from the anesthesia or the breathing tube placed in your throat during surgery. If this causes discomfort, gargle with warm salt water. The discomfort should disappear within 24 hours. ° °If you had a scopolamine patch placed behind your ear for the management of post- operative nausea and/or vomiting: ° °1. The medication in the patch is effective for 72 hours, after which it should be removed.  Wrap patch in a tissue and discard in the trash. Wash hands thoroughly with soap and water. °2.   You may remove the patch earlier than 72 hours if you experience unpleasant side effects which may include dry mouth, dizziness or visual disturbances. °3. Avoid touching the patch. Wash your hands with soap and water after contact with the patch. °   °Regional Anesthesia Blocks ° °1. Numbness or the inability to move the "blocked" extremity may last from 3-48  hours after placement. The length of time depends on the medication injected and your individual response to the medication. If the numbness is not going away after 48 hours, call your surgeon. ° °2. The extremity that is blocked will need to be protected until the numbness is gone and the  Strength has returned. Because you cannot feel it, you will need to take extra care to avoid injury. Because it may be weak, you may have difficulty moving it or using it. You may not know what position it is in without looking at it while the block is in effect. ° °3. For blocks in the legs and feet, returning to weight bearing and walking needs to be done carefully. You will need to wait until the numbness is entirely gone and the strength has returned. You should be able to move your leg and foot normally before you try and bear weight or walk. You will need someone to be with you when you first try to ensure you do not fall and possibly risk injury. ° °4. Bruising and tenderness at the needle site are common side effects and will resolve in a few days. ° °5. Persistent numbness or new problems with movement should be communicated to the surgeon or the [facility] ([phone])/ [facility] ([phone]). °

## 2018-08-08 NOTE — Transfer of Care (Signed)
I Last Vitals:  Vitals Value Taken Time  BP 116/82 08/08/2018  3:05 PM  Temp    Pulse 94 08/08/2018  3:07 PM  Resp 12 08/08/2018  3:07 PM  SpO2 100 % 08/08/2018  3:07 PM  Vitals shown include unvalidated device data.  Last Pain:  Vitals:   08/08/18 1108  TempSrc: Oral  PainSc: 7       Patients Stated Pain Goal: 10 (08/08/18 1108)  Immediate Anesthesia Transfer of Care Note  Patient: Toni Copeland  Procedure(s) Performed: Procedure(s) (LRB): OPEN REDUCTION INTERNAL FIXATION (ORIF) LEFT ANKLE FRACTURE (Left) SYNDESMOSIS REPAIR (Left)  Patient Location: PACU  Anesthesia Type: General  Level of Consciousness: awake, alert  and oriented  Airway & Oxygen Therapy: Patient Spontanous Breathing and Patient connected to face mask oxygen  Post-op Assessment: Report given to PACU RN and Post -op Vital signs reviewed and stable  Post vital signs: Reviewed and stable  Complications: No apparent anesthesia complications

## 2018-08-08 NOTE — Progress Notes (Signed)
Assisted Dr. Miller with left, ultrasound guided, popliteal, adductor canal block. Side rails up, monitors on throughout procedure. See vital signs in flow sheet. Tolerated Procedure well. 

## 2018-08-09 ENCOUNTER — Encounter (HOSPITAL_BASED_OUTPATIENT_CLINIC_OR_DEPARTMENT_OTHER): Payer: Self-pay | Admitting: Orthopaedic Surgery

## 2018-08-09 NOTE — Anesthesia Postprocedure Evaluation (Signed)
Anesthesia Post Note  Patient: Gwendolynn S Blust  Procedure(s) Performed: OPEN REDUCTION INTERNAL FIXATION (ORIF) LEFT ANKLE FRACTURE (Left Ankle) SYNDESMOSIS REPAIR (Left Ankle)     Patient location during evaluation: PACU Anesthesia Type: Regional and General Level of consciousness: awake and alert Pain management: pain level controlled Vital Signs Assessment: post-procedure vital signs reviewed and stable Respiratory status: spontaneous breathing, nonlabored ventilation and respiratory function stable Cardiovascular status: blood pressure returned to baseline and stable Postop Assessment: no apparent nausea or vomiting Anesthetic complications: no    Last Vitals:  Vitals:   08/08/18 1545 08/08/18 1615  BP: 117/83 126/74  Pulse: 84 66  Resp: 16 18  Temp:  36.4 C  SpO2: 100% 100%    Last Pain:  Vitals:   08/08/18 1615  TempSrc:   PainSc: 0-No pain                 Lowella Curb

## 2020-05-07 IMAGING — RF LEFT ANKLE COMPLETE - 3+ VIEW
1 series · 4 of 4 positions shown · non-contrast
Comparison: None.

CLINICAL DATA: ORIF left ankle fracture.

EXAM:
DG C-ARM 61-120 MIN; LEFT ANKLE COMPLETE - 3+ VIEW
FLUOROSCOPY TIME:  21 seconds

[Series 1: run · 4 of 4 slices shown]
[im 1/4]
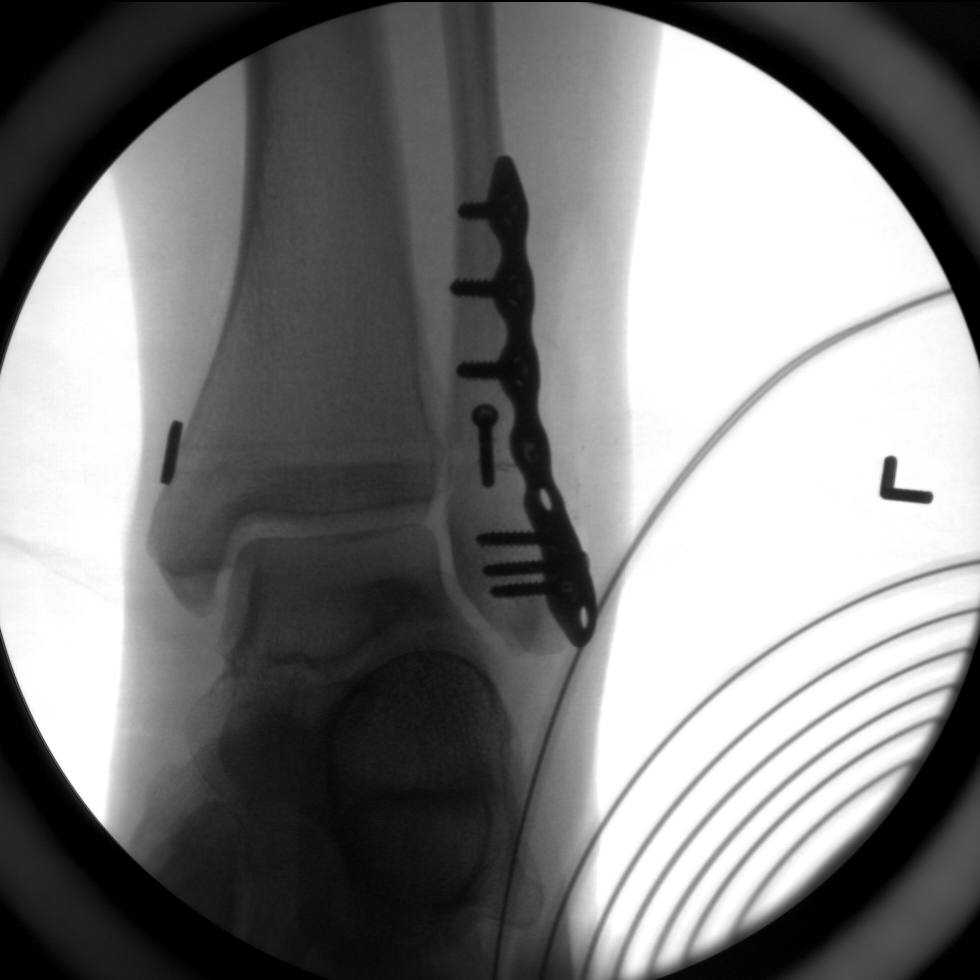
[im 2/4]
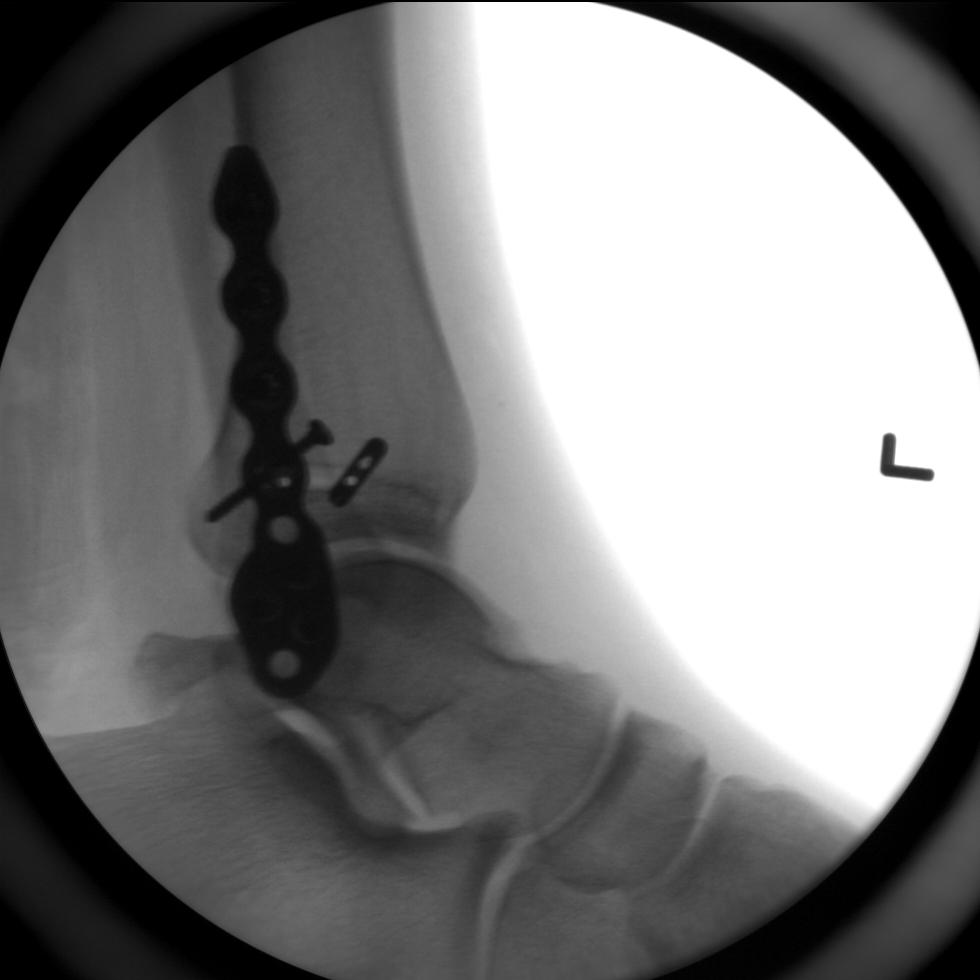
[im 3/4]
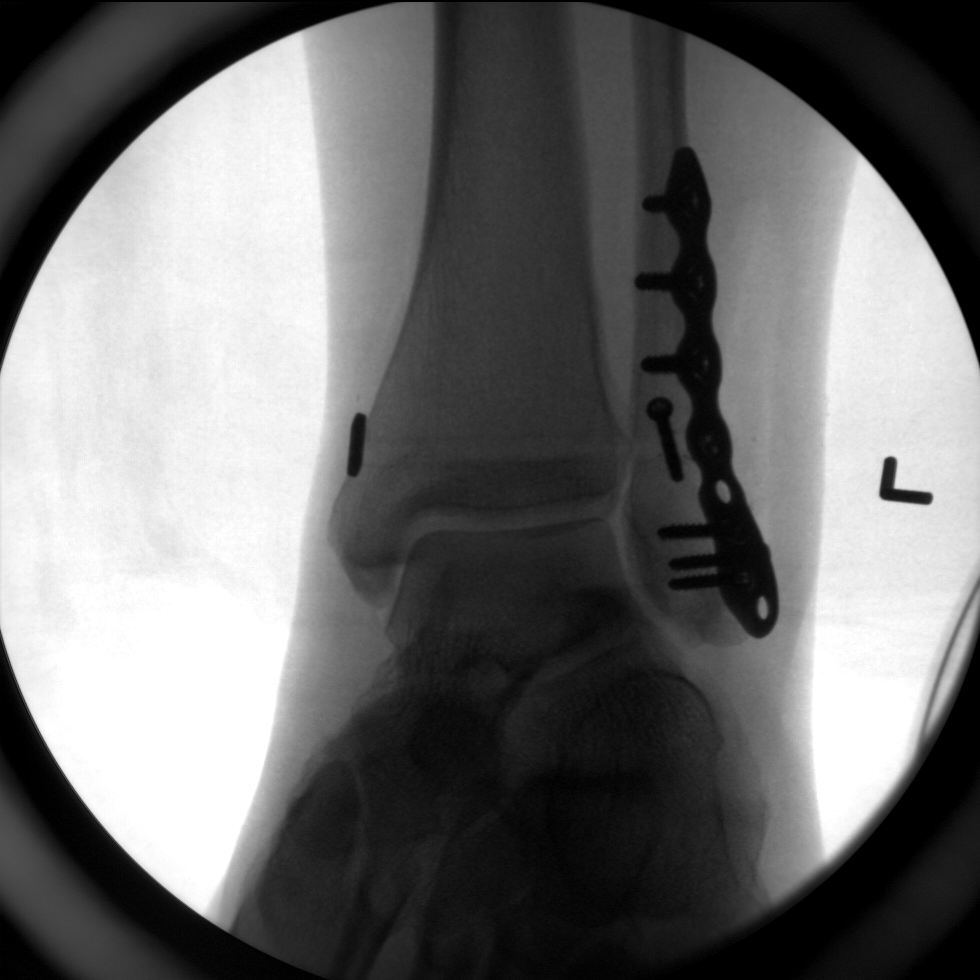
[im 4/4]
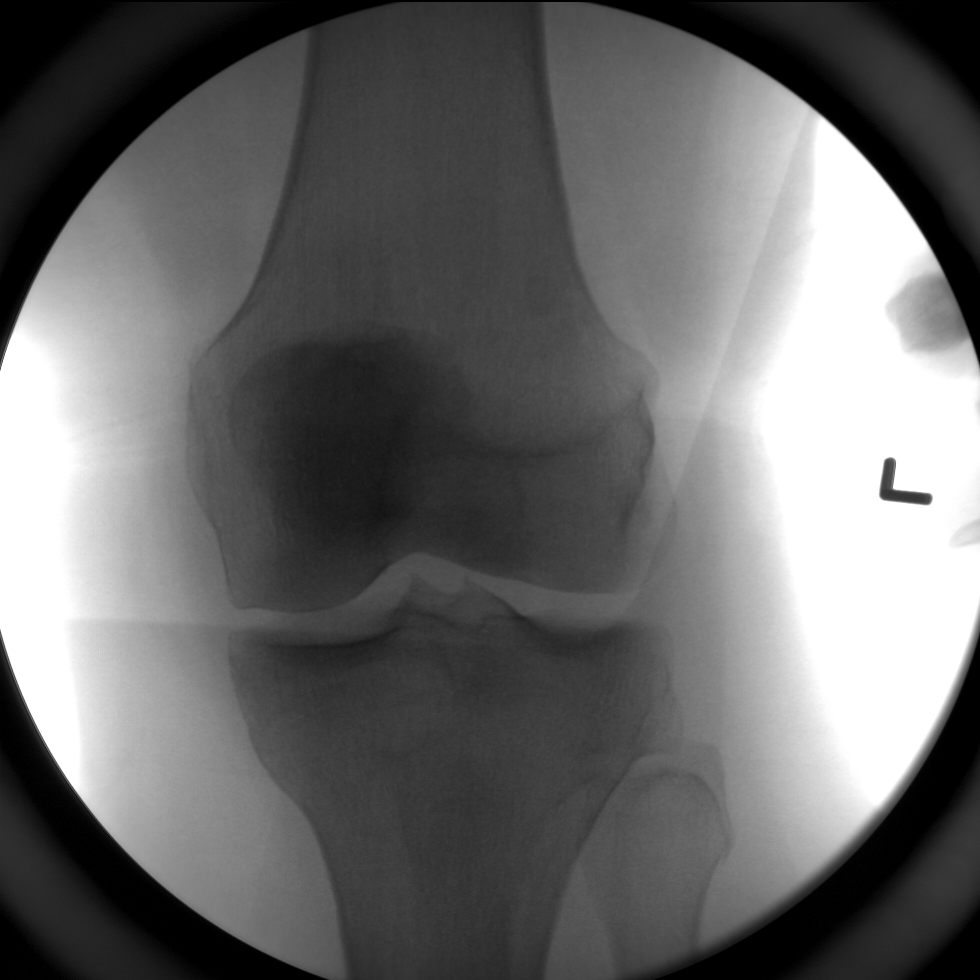

[4 of 4 positions shown; findings below may reference images not displayed]

FINDINGS: Three spot fluoroscopic images of the left ankle and a single spot
fluoroscopic image of the left knee are provided for review.

Images demonstrate the sequela of sideplate fixation of the lateral
malleolus with suspected additional fixation of the distal tib-fib
joint.

Alignment appears anatomic. No definite evidence of hardware failure
or loosening.

Solitary AP image of the left knee suggests mild degenerative change
involving the medial compartment with joint space loss and
subchondral sclerosis. There is minimal spurring of the tibial
spines.

Regional soft tissues appear normal.  No radiopaque foreign body.
IMPRESSION: 1. Post ORIF of the lateral malleolus.
2. Suspected mild degenerative change involving the medial
compartment of the left knee.

## 2022-07-01 ENCOUNTER — Other Ambulatory Visit (HOSPITAL_COMMUNITY): Payer: Self-pay | Admitting: Gastroenterology

## 2022-07-01 ENCOUNTER — Ambulatory Visit (HOSPITAL_BASED_OUTPATIENT_CLINIC_OR_DEPARTMENT_OTHER)
Admission: RE | Admit: 2022-07-01 | Discharge: 2022-07-01 | Disposition: A | Discharge status: Home / Self Care | Payer: No Typology Code available for payment source | Source: Ambulatory Visit | Attending: Gastroenterology | Admitting: Gastroenterology

## 2022-07-01 DIAGNOSIS — R197 Diarrhea, unspecified: Secondary | ICD-10-CM | POA: Diagnosis present

## 2022-07-01 DIAGNOSIS — R1033 Periumbilical pain: Secondary | ICD-10-CM | POA: Diagnosis not present

## 2022-07-01 DIAGNOSIS — R1013 Epigastric pain: Secondary | ICD-10-CM

## 2022-07-01 MED ORDER — IOHEXOL 300 MG/ML  SOLN
100.0000 mL | Freq: Once | INTRAMUSCULAR | Status: AC | PRN
  Administered 2022-07-01: 85 mL via INTRAVENOUS
